# Patient Record
Sex: Female | Born: 1975 | Race: White | Hispanic: No | Smoking: Current every day smoker
Health system: Southern US, Community
[De-identification: ages and names within clinical notes are randomized; demographics above are authoritative.]

## PROBLEM LIST (undated history)

## (undated) DIAGNOSIS — R928 Other abnormal and inconclusive findings on diagnostic imaging of breast: Secondary | ICD-10-CM

## (undated) HISTORY — DX: Other abnormal and inconclusive findings on diagnostic imaging of breast: R92.8

## (undated) HISTORY — PX: TUBAL LIGATION: SHX77

---

## 2008-08-07 ENCOUNTER — Other Ambulatory Visit: Admission: RE | Admit: 2008-08-07 | Discharge: 2008-08-07 | Payer: Self-pay | Admitting: Obstetrics and Gynecology

## 2012-06-26 ENCOUNTER — Emergency Department (HOSPITAL_COMMUNITY)
Admission: EM | Admit: 2012-06-26 | Discharge: 2012-06-26 | Disposition: A | Discharge status: Home / Self Care | Payer: Medicaid Other | Attending: Emergency Medicine | Admitting: Emergency Medicine

## 2012-06-26 ENCOUNTER — Encounter (HOSPITAL_COMMUNITY): Payer: Self-pay

## 2012-06-26 DIAGNOSIS — B9689 Other specified bacterial agents as the cause of diseases classified elsewhere: Secondary | ICD-10-CM | POA: Insufficient documentation

## 2012-06-26 DIAGNOSIS — N939 Abnormal uterine and vaginal bleeding, unspecified: Secondary | ICD-10-CM

## 2012-06-26 DIAGNOSIS — A499 Bacterial infection, unspecified: Secondary | ICD-10-CM | POA: Insufficient documentation

## 2012-06-26 DIAGNOSIS — N760 Acute vaginitis: Secondary | ICD-10-CM | POA: Insufficient documentation

## 2012-06-26 DIAGNOSIS — F17200 Nicotine dependence, unspecified, uncomplicated: Secondary | ICD-10-CM | POA: Insufficient documentation

## 2012-06-26 LAB — WET PREP, GENITAL
Trich, Wet Prep: NONE SEEN
Yeast Wet Prep HPF POC: NONE SEEN

## 2012-06-26 LAB — URINALYSIS, ROUTINE W REFLEX MICROSCOPIC
Glucose, UA: NEGATIVE mg/dL
Leukocytes, UA: NEGATIVE
Specific Gravity, Urine: 1.015 (ref 1.005–1.030)
pH: 6 (ref 5.0–8.0)

## 2012-06-26 MED ORDER — METRONIDAZOLE 500 MG PO TABS: 500.0000 mg | ORAL_TABLET | Freq: Two times a day (BID) | ORAL | Status: AC

## 2012-06-26 NOTE — ED Notes (Signed)
Pt c/o vaginal bleeding after having sex. Denies abdominal pain or vaginal discharge. Pt alert and oriented x 3. Skin warm and dry. Color pink .

## 2012-06-26 NOTE — ED Provider Notes (Signed)
History     CSN: [id number]  Arrival date & time 06/26/12  1514   First MD Initiated Contact with Patient 06/26/12 1516      Chief Complaint  Patient presents with  . Vaginal Bleeding     HPI Pt was seen at 1525.  Per pt, c/o gradual onset and persistence of intermittent episodes of vaginal bleeding for the past week.  Pt states the vaginal bleeding only occurs during sex.  Endorses her usual menses was within the last week.  Denies partner has any piercings in his penis.  Denies injury.  Denies abd/pelvic pain, no vaginal discharge, no dysuria/hematuria, no N/V/D, no fevers, no rash.    [facility]N: [facility] reviewed. No pertinent past medical history.  Past Surgical History  Procedure Date  . Tubal ligation     History  Substance Use Topics  . Smoking status: Current Everyday Smoker    Types: Cigarettes  . Smokeless tobacco: Not on file  . Alcohol Use: No    Review of Systems ROS: Statement: All systems negative except as marked or noted in the HPI; Constitutional: Negative for fever and chills. ; ; Eyes: Negative for eye pain, redness and discharge. ; ; ENMT: Negative for ear pain, hoarseness, nasal congestion, sinus pressure and sore throat. ; ; Cardiovascular: Negative for chest pain, palpitations, diaphoresis, dyspnea and peripheral edema. ; ; Respiratory: Negative for cough, wheezing and stridor. ; ; Gastrointestinal: Negative for nausea, vomiting, diarrhea, abdominal pain, blood in stool, hematemesis, jaundice and rectal bleeding. . ; ; Genitourinary: Negative for dysuria, flank pain and hematuria. ; ; GYN:  +vaginal bleeding, no vaginal discharge, no vulvar pain.;; Musculoskeletal: Negative for back pain and neck pain. Negative for swelling and trauma.; ; Skin: Negative for pruritus, rash, abrasions, blisters, bruising and skin lesion.; ; Neuro: Negative for headache, lightheadedness and neck stiffness. Negative for weakness, altered level of consciousness , altered  mental status, extremity weakness, paresthesias, involuntary movement, seizure and syncope.     Allergies  Review of patient's allergies indicates no known allergies.  Home Medications  No current outpatient prescriptions on file.  BP 108/68  Pulse 99  Temp 97.8 F (36.6 C) (Oral)  Resp 18  Ht 5\' 5"  (1.651 m)  Wt 112 lb (50.803 kg)  BMI 18.64 kg/m2  SpO2 100%  LMP 06/17/2012  Physical Exam 1530: Physical examination:  Nursing notes reviewed; Vital signs and O2 SAT reviewed;  Constitutional: Well developed, Well nourished, Well hydrated, In no acute distress; Head:  Normocephalic, atraumatic; Eyes: EOMI, PERRL, No scleral icterus; ENMT: Mouth and pharynx normal, Mucous membranes moist; Neck: Supple, Full range of motion, No lymphadenopathy; Cardiovascular: Regular rate and rhythm, No murmur, rub, or gallop; Respiratory: Breath sounds clear & equal bilaterally, No rales, rhonchi, wheezes.  Speaking full sentences with ease, Normal respiratory effort/excursion; Chest: Nontender, Movement normal; Abdomen: Soft, Nontender, Nondistended, Normal bowel sounds; Genitourinary: Pelvic exam performed with permission of pt and female ED RN assist during exam.  External genitalia w/o lesions. Vaginal vault without discharge, scant amount of blood from os.  Cervix w/o lesions, not friable, GC/chlam and wet prep obtained and sent to lab.  Bimanual exam w/o CMT, uterine or adnexal tenderness.;; Extremities: Pulses normal, No tenderness, No edema, No calf edema or asymmetry.; Neuro: AA&Ox3, Major CN grossly intact.  Speech clear. Gait steady. No gross focal motor or sensory deficits in extremities.; Skin: Color normal, Warm, Dry.   ED Course  Procedures   MDM  MDM Reviewed: nursing  note, vitals and previous chart Interpretation: labs   Results for orders placed during the hospital encounter of 06/26/12  PREGNANCY, URINE      Component Value Range   Preg Test, Ur NEGATIVE  NEGATIVE  URINALYSIS,  ROUTINE W REFLEX MICROSCOPIC      Component Value Range   Color, Urine YELLOW  YELLOW   APPearance CLEAR  CLEAR   Specific Gravity, Urine 1.015  1.005 - 1.030   pH 6.0  5.0 - 8.0   Glucose, UA NEGATIVE  NEGATIVE mg/dL   Hgb urine dipstick MODERATE (*) NEGATIVE   Bilirubin Urine NEGATIVE  NEGATIVE   Ketones, ur NEGATIVE  NEGATIVE mg/dL   Protein, ur NEGATIVE  NEGATIVE mg/dL   Urobilinogen, UA 0.2  0.0 - 1.0 mg/dL   Nitrite NEGATIVE  NEGATIVE   Leukocytes, UA NEGATIVE  NEGATIVE  WET PREP, GENITAL      Component Value Range   Yeast Wet Prep HPF POC NONE SEEN  NONE SEEN   Trich, Wet Prep NONE SEEN  NONE SEEN   Clue Cells Wet Prep HPF POC FEW (*) NONE SEEN   WBC, Wet Prep HPF POC NONE SEEN  NONE SEEN  URINE MICROSCOPIC-ADD ON      Component Value Range   Squamous Epithelial / LPF FEW (*) RARE   WBC, UA 0-2  <3 WBC/hpf   RBC / HPF 3-6  <3 RBC/hpf     3:58 PM:  Will treat for BV.  Dx testing d/w pt.  Questions answered.  Verb understanding, agreeable to d/c home with outpt f/u with her OB/GYN.          Laray Anger, DO 06/27/12 1953

## 2012-06-26 NOTE — ED Notes (Signed)
Dr. Clarene Duke in to see patient and pelvic exam done.

## 2012-06-26 NOTE — ED Notes (Signed)
Pt states she had had vaginal bleeding during sex

## 2012-06-27 LAB — GC/CHLAMYDIA PROBE AMP, GENITAL: GC Probe Amp, Genital: NEGATIVE

## 2013-10-23 ENCOUNTER — Ambulatory Visit: Payer: Self-pay | Admitting: Obstetrics & Gynecology

## 2014-02-15 ENCOUNTER — Other Ambulatory Visit: Payer: Self-pay | Admitting: Obstetrics & Gynecology

## 2014-02-16 ENCOUNTER — Other Ambulatory Visit: Payer: Self-pay | Admitting: Obstetrics & Gynecology

## 2014-02-26 ENCOUNTER — Other Ambulatory Visit: Payer: Self-pay | Admitting: Obstetrics & Gynecology

## 2014-03-05 ENCOUNTER — Other Ambulatory Visit: Payer: Self-pay | Admitting: Obstetrics & Gynecology

## 2014-03-05 ENCOUNTER — Encounter (INDEPENDENT_AMBULATORY_CARE_PROVIDER_SITE_OTHER): Payer: Self-pay

## 2018-03-28 ENCOUNTER — Emergency Department (HOSPITAL_COMMUNITY): Payer: No Typology Code available for payment source

## 2018-03-28 ENCOUNTER — Other Ambulatory Visit: Payer: Self-pay

## 2018-03-28 ENCOUNTER — Emergency Department (HOSPITAL_COMMUNITY)
Admission: EM | Admit: 2018-03-28 | Discharge: 2018-03-28 | Disposition: A | Discharge status: Home / Self Care | Payer: No Typology Code available for payment source | Attending: Emergency Medicine | Admitting: Emergency Medicine

## 2018-03-28 ENCOUNTER — Encounter (HOSPITAL_COMMUNITY): Payer: Self-pay | Admitting: Emergency Medicine

## 2018-03-28 DIAGNOSIS — S199XXA Unspecified injury of neck, initial encounter: Secondary | ICD-10-CM | POA: Diagnosis present

## 2018-03-28 DIAGNOSIS — S0081XA Abrasion of other part of head, initial encounter: Secondary | ICD-10-CM | POA: Insufficient documentation

## 2018-03-28 DIAGNOSIS — Y9389 Activity, other specified: Secondary | ICD-10-CM | POA: Diagnosis not present

## 2018-03-28 DIAGNOSIS — Y92410 Unspecified street and highway as the place of occurrence of the external cause: Secondary | ICD-10-CM | POA: Insufficient documentation

## 2018-03-28 DIAGNOSIS — S161XXA Strain of muscle, fascia and tendon at neck level, initial encounter: Secondary | ICD-10-CM | POA: Insufficient documentation

## 2018-03-28 DIAGNOSIS — S39012A Strain of muscle, fascia and tendon of lower back, initial encounter: Secondary | ICD-10-CM | POA: Insufficient documentation

## 2018-03-28 DIAGNOSIS — Y998 Other external cause status: Secondary | ICD-10-CM | POA: Insufficient documentation

## 2018-03-28 DIAGNOSIS — F17210 Nicotine dependence, cigarettes, uncomplicated: Secondary | ICD-10-CM | POA: Diagnosis not present

## 2018-03-28 DIAGNOSIS — R51 Headache: Secondary | ICD-10-CM | POA: Insufficient documentation

## 2018-03-28 MED ORDER — IBUPROFEN 600 MG PO TABS: 600.0000 mg | ORAL_TABLET | Freq: Four times a day (QID) | ORAL | 0 refills | Status: DC | PRN

## 2018-03-28 MED ORDER — CYCLOBENZAPRINE HCL 10 MG PO TABS: 10.0000 mg | ORAL_TABLET | Freq: Three times a day (TID) | ORAL | 0 refills | Status: DC | PRN
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
## 2018-03-28 NOTE — Discharge Instructions (Addendum)
As discussed, apply ice packs on/off to your neck and back.  Avoid bending and heavy lifting for one week.  Follow-up with your doctor for recheck.  Return here for any worsening symptoms such as persistent vomiting, sudden visual changes, lethargy or dizziness.

## 2018-03-28 NOTE — ED Notes (Signed)
Spoke with dr Effie Shywentz about pt   Orders received
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
## 2018-04-01 ENCOUNTER — Emergency Department (HOSPITAL_COMMUNITY)
Admission: EM | Admit: 2018-04-01 | Discharge: 2018-04-01 | Disposition: A | Discharge status: Home / Self Care | Payer: No Typology Code available for payment source | Attending: Emergency Medicine | Admitting: Emergency Medicine

## 2018-04-01 ENCOUNTER — Other Ambulatory Visit: Payer: Self-pay

## 2018-04-01 ENCOUNTER — Encounter (HOSPITAL_COMMUNITY): Payer: Self-pay | Admitting: Emergency Medicine

## 2018-04-01 DIAGNOSIS — Y939 Activity, unspecified: Secondary | ICD-10-CM | POA: Insufficient documentation

## 2018-04-01 DIAGNOSIS — S060X0A Concussion without loss of consciousness, initial encounter: Secondary | ICD-10-CM | POA: Insufficient documentation

## 2018-04-01 DIAGNOSIS — F17210 Nicotine dependence, cigarettes, uncomplicated: Secondary | ICD-10-CM | POA: Diagnosis not present

## 2018-04-01 DIAGNOSIS — R110 Nausea: Secondary | ICD-10-CM | POA: Diagnosis not present

## 2018-04-01 DIAGNOSIS — Y998 Other external cause status: Secondary | ICD-10-CM | POA: Diagnosis not present

## 2018-04-01 DIAGNOSIS — R42 Dizziness and giddiness: Secondary | ICD-10-CM | POA: Diagnosis not present

## 2018-04-01 DIAGNOSIS — R51 Headache: Secondary | ICD-10-CM | POA: Insufficient documentation

## 2018-04-01 DIAGNOSIS — Y92410 Unspecified street and highway as the place of occurrence of the external cause: Secondary | ICD-10-CM | POA: Insufficient documentation

## 2018-04-01 DIAGNOSIS — M542 Cervicalgia: Secondary | ICD-10-CM | POA: Diagnosis not present

## 2018-04-01 DIAGNOSIS — S0990XD Unspecified injury of head, subsequent encounter: Secondary | ICD-10-CM | POA: Diagnosis present

## 2018-04-01 MED ORDER — ONDANSETRON HCL 4 MG PO TABS: 4.0000 mg | ORAL_TABLET | Freq: Four times a day (QID) | ORAL | 0 refills | Status: DC
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
## 2018-04-01 NOTE — ED Notes (Signed)
Pt continues to await provider.  Denies any needs.

## 2018-04-01 NOTE — Discharge Instructions (Signed)
Your vital signs are within normal limits.  I reviewed the CT scans from your previous visit, and no changes appreciated.  Please see Dr. Ayesha MohairZack Smith at the concussion clinic for additional evaluation and management.  Use Zofran as needed for the nausea.  Please change her positions slowly when standing or walking or sitting.  Continue your ibuprofen for the soreness in your neck.  Please return to the emergency department if any changes in your condition, problems, or concerns.

## 2018-05-09 ENCOUNTER — Other Ambulatory Visit: Payer: Self-pay

## 2018-05-09 ENCOUNTER — Emergency Department (HOSPITAL_COMMUNITY)
Admission: EM | Admit: 2018-05-09 | Discharge: 2018-05-09 | Disposition: A | Discharge status: Home / Self Care | Payer: Self-pay | Attending: Emergency Medicine | Admitting: Emergency Medicine

## 2018-05-09 ENCOUNTER — Encounter (HOSPITAL_COMMUNITY): Payer: Self-pay

## 2018-05-09 DIAGNOSIS — L03213 Periorbital cellulitis: Secondary | ICD-10-CM | POA: Insufficient documentation

## 2018-05-09 DIAGNOSIS — F17210 Nicotine dependence, cigarettes, uncomplicated: Secondary | ICD-10-CM | POA: Insufficient documentation

## 2018-05-09 MED ORDER — PREDNISONE 20 MG PO TABS: 20.0000 mg | ORAL_TABLET | Freq: Two times a day (BID) | ORAL | 0 refills | Status: DC

## 2018-05-09 MED ORDER — CEPHALEXIN 500 MG PO CAPS: 500.0000 mg | ORAL_CAPSULE | Freq: Three times a day (TID) | ORAL | 0 refills | Status: DC

## 2018-05-09 MED ORDER — CEPHALEXIN 500 MG PO CAPS
500.0000 mg | ORAL_CAPSULE | Freq: Once | ORAL | Status: AC
  Administered 2018-05-09: 500 mg via ORAL
  Filled 2018-05-09: qty 1

## 2018-05-09 MED ORDER — PREDNISONE 50 MG PO TABS
60.0000 mg | ORAL_TABLET | Freq: Once | ORAL | Status: AC
  Administered 2018-05-09: 60 mg via ORAL
  Filled 2018-05-09: qty 1

## 2018-05-09 NOTE — ED Triage Notes (Addendum)
Patient has laceration noted above left upper eye lid due to dog fence hitting eye lid Friday. Patient denies any injury to eye. Swelling noted to left eye into left cheek face. Denies visual disturbances. Last tetanus shot unknown.

## 2018-05-09 NOTE — Discharge Instructions (Addendum)
Cool compress to eye three times per day. Recheck with fever, ANY, svsion changes, pain with movement of eye, or other changes

## 2018-05-09 NOTE — ED Provider Notes (Signed)
[facility] EMERGENCY DEPARTMENT Provider Note   CSN: [id number] Arrival date & time: 05/09/18  0945     History   Chief Complaint Chief Complaint  Patient presents with  . Eye Injury    HPI Renee Fuller is a 42 y.o. female.  Complaint is eye swelling after injury.  HPI 42 year old female.  2 days ago she was leaning over next to a fence.  A piece of protruding wire poked her in her left upper eyelid.  Her vision has been normal.  Minimal bleeding.  However it became more swollen last night and this morning and she presents here.  She states she went to work today but left because "it was embarrassing".  History reviewed. No pertinent past medical history.  There are no active problems to display for this patient.   Past Surgical History:  Procedure Laterality Date  . TUBAL LIGATION       OB History   None      Home Medications    Prior to Admission medications   Medication Sig Start Date End Date Taking? Authorizing Provider  cephALEXin (KEFLEX) 500 MG capsule Take 1 capsule (500 mg total) by mouth 3 (three) times daily. 05/09/18   Rolland Porter, MD  cyclobenzaprine (FLEXERIL) 10 MG tablet Take 1 tablet (10 mg total) by mouth 3 (three) times daily as needed. 03/28/18   Triplett, Tammy, PA-C  ibuprofen (ADVIL,MOTRIN) 600 MG tablet Take 1 tablet (600 mg total) by mouth every 6 (six) hours as needed. Take with food 03/28/18   Triplett, Tammy, PA-C  ondansetron (ZOFRAN) 4 MG tablet Take 1 tablet (4 mg total) by mouth every 6 (six) hours. 04/01/18   Ivery Quale, PA-C  predniSONE (DELTASONE) 20 MG tablet Take 1 tablet (20 mg total) by mouth 2 (two) times daily with a meal. 05/09/18   Rolland Porter, MD    Family History No family history on file.  Social History Social History   Tobacco Use  . Smoking status: Current Every Day Smoker    Types: Cigarettes  . Smokeless tobacco: Never Used  Substance Use Topics  . Alcohol use: No  . Drug use: No     Allergies     Patient has no known allergies.   Review of Systems Review of Systems  Constitutional: Negative for appetite change, chills, diaphoresis, fatigue and fever.  HENT: Negative for mouth sores, sore throat and trouble swallowing.   Eyes: Negative for visual disturbance.       Eyelid swelling  Respiratory: Negative for cough, chest tightness, shortness of breath and wheezing.   Cardiovascular: Negative for chest pain.  Gastrointestinal: Negative for abdominal distention, abdominal pain, diarrhea, nausea and vomiting.  Endocrine: Negative for polydipsia, polyphagia and polyuria.  Genitourinary: Negative for dysuria, frequency and hematuria.  Musculoskeletal: Negative for gait problem.  Skin: Negative for color change, pallor and rash.  Neurological: Negative for dizziness, syncope, light-headedness and headaches.  Hematological: Does not bruise/bleed easily.  Psychiatric/Behavioral: Negative for behavioral problems and confusion.     Physical Exam Updated Vital Signs BP 112/67 (BP Location: Right Arm)   Pulse 77   Temp 97.7 F (36.5 C) (Oral)   Resp 14   Ht  (1.6 m)   Wt 53.5 kg (118 lb)   LMP 05/09/2018   SpO2 100%   BMI 20.90 kg/m   Physical Exam  Constitutional: She is oriented to person, place, and time. She appears well-developed and well-nourished. No distress.  HENT:  Head: Normocephalic.  Eyes: Pupils are equal, round, and reactive to light. Conjunctivae are normal. No scleral icterus.  Soft tissue swelling and erythema of the left upper lid greater than left lower lid.  No extraocular movement entrapment.  No proptosis or enophthalmos.  Normal-appearing globe.  Symmetric pupil.  No direct or consensual photophobia.  The lid is everted and there is no full-thickness puncture.  Neck: Normal range of motion. Neck supple. No thyromegaly present.  Cardiovascular: Normal rate and regular rhythm. Exam reveals no gallop and no friction rub.  No murmur  heard. Pulmonary/Chest: Effort normal and breath sounds normal. No respiratory distress. She has no wheezes. She has no rales.  Abdominal: Soft. Bowel sounds are normal. She exhibits no distension. There is no tenderness. There is no rebound.  Musculoskeletal: Normal range of motion.  Neurological: She is alert and oriented to person, place, and time.  Skin: Skin is warm and dry. No rash noted.  Psychiatric: She has a normal mood and affect. Her behavior is normal.     ED Treatments / Results  Labs (all labs ordered are listed, but only abnormal results are displayed) Labs Reviewed - No data to display  EKG None  Radiology No results found.  Procedures Procedures (including critical care time)  Medications Ordered in ED Medications  predniSONE (DELTASONE) tablet 60 mg (60 mg Oral Given 05/09/18 1351)  cephALEXin (KEFLEX) capsule 500 mg (500 mg Oral Given 05/09/18 1351)     Initial Impression / Assessment and Plan / ED Course  I have reviewed the triage vital signs and the nursing notes.  Pertinent labs & imaging results that were available during my care of the patient were reviewed by me and considered in my medical decision making (see chart for details).     Likely simply periorbital edema.  Will do twice daily prednisone x2 days.  Warm or cool compresses for comfort.  Keflex.  Recheck vision changes, pain with eye movement, other worsening.  Final Clinical Impressions(s) / ED Diagnoses   Final diagnoses:  Periorbital cellulitis of left eye    ED Discharge Orders        Ordered    predniSONE (DELTASONE) 20 MG tablet  2 times daily with meals     05/09/18 1354    cephALEXin (KEFLEX) 500 MG capsule  3 times daily     05/09/18 1354       Rolland Porter, MD 05/09/18 1356

## 2018-11-30 ENCOUNTER — Other Ambulatory Visit: Payer: Self-pay

## 2018-11-30 ENCOUNTER — Encounter (HOSPITAL_COMMUNITY): Payer: Self-pay | Admitting: Emergency Medicine

## 2018-11-30 ENCOUNTER — Emergency Department (HOSPITAL_COMMUNITY)
Admission: EM | Admit: 2018-11-30 | Discharge: 2018-11-30 | Disposition: A | Discharge status: Home / Self Care | Payer: Self-pay | Attending: Emergency Medicine | Admitting: Emergency Medicine

## 2018-11-30 DIAGNOSIS — N611 Abscess of the breast and nipple: Secondary | ICD-10-CM | POA: Insufficient documentation

## 2018-11-30 DIAGNOSIS — F17210 Nicotine dependence, cigarettes, uncomplicated: Secondary | ICD-10-CM | POA: Insufficient documentation

## 2018-11-30 MED ORDER — CEFAZOLIN SODIUM-DEXTROSE 1-4 GM/50ML-% IV SOLN
1.0000 g | Freq: Once | INTRAVENOUS | Status: AC
  Administered 2018-11-30: 1 g via INTRAVENOUS
  Filled 2018-11-30: qty 50

## 2018-11-30 MED ORDER — SULFAMETHOXAZOLE-TRIMETHOPRIM 800-160 MG PO TABS
1.0000 | ORAL_TABLET | Freq: Once | ORAL | Status: AC
  Administered 2018-11-30: 1 via ORAL
  Filled 2018-11-30: qty 1

## 2018-11-30 MED ORDER — IBUPROFEN 400 MG PO TABS
400.0000 mg | ORAL_TABLET | Freq: Once | ORAL | Status: AC
  Administered 2018-11-30: 400 mg via ORAL
  Filled 2018-11-30: qty 1

## 2018-11-30 MED ORDER — LIDOCAINE-EPINEPHRINE (PF) 2 %-1:200000 IJ SOLN
10.0000 mL | Freq: Once | INTRAMUSCULAR | Status: AC
  Administered 2018-11-30: 10 mL
  Filled 2018-11-30: qty 10

## 2018-11-30 MED ORDER — SULFAMETHOXAZOLE-TRIMETHOPRIM 800-160 MG PO TABS: 1.0000 | ORAL_TABLET | Freq: Two times a day (BID) | ORAL | 0 refills | Status: AC

## 2018-11-30 MED ORDER — PENTAFLUOROPROP-TETRAFLUOROETH EX AERO
INHALATION_SPRAY | Freq: Once | CUTANEOUS | Status: AC
  Administered 2018-11-30: 21:00:00 via TOPICAL
  Filled 2018-11-30: qty 116

## 2018-11-30 NOTE — Discharge Instructions (Addendum)
Change your dressings if needed (if it drains through) before your appointment with Dr. Henreitta LeberBridges tomorrow. If it is still clean, you may leave it in place.  Take your next dose of the antibiotic tomorrow morning.  Your appointment with Dr. Henreitta LeberBridges is at 2:30 in her office.

## 2018-11-30 NOTE — ED Provider Notes (Signed)
[facility] EMERGENCY DEPARTMENT Provider Note   CSN: [id number] Arrival date & time: 11/30/18  1847     History   Chief Complaint Chief Complaint  Patient presents with  . Abscess    HPI Renee Fuller is a 42 y.o. female.  The history is provided by the patient.  Abscess  Location:  Torso Torso abscess location:  R breast Abscess quality: fluctuance, induration, painful, redness and warmth   Duration:  1 week Progression:  Worsening Pain details:    Quality:  Sharp and throbbing   Severity:  Severe   Progression:  Worsening Chronicity:  New Context: not diabetes and not skin injury   Relieved by:  Nothing Worsened by:  Nothing Ineffective treatments:  Warm water soaks Associated symptoms: no fever, no headaches, no nausea and no vomiting   Risk factors: no prior abscess   Risk factors comment:  Reports she had a breast nodule at this same site 10 years ago, was advised to get a biopsy but never did (while living in [place]).    History reviewed. No pertinent past medical history.  There are no active problems to display for this patient.   Past Surgical History:  Procedure Laterality Date  . TUBAL LIGATION       OB History   None      Home Medications    Prior to Admission medications   Medication Sig Start Date End Date Taking? Authorizing Provider  cephALEXin (KEFLEX) 500 MG capsule Take 1 capsule (500 mg total) by mouth 3 (three) times daily. 05/09/18   Rolland PorterJames, Mark, MD  cyclobenzaprine (FLEXERIL) 10 MG tablet Take 1 tablet (10 mg total) by mouth 3 (three) times daily as needed. 03/28/18   Triplett, Tammy, PA-C  ibuprofen (ADVIL,MOTRIN) 600 MG tablet Take 1 tablet (600 mg total) by mouth every 6 (six) hours as needed. Take with food 03/28/18   Triplett, Tammy, PA-C  ondansetron (ZOFRAN) 4 MG tablet Take 1 tablet (4 mg total) by mouth every 6 (six) hours. 04/01/18   Ivery QualeBryant, Hobson, PA-C  predniSONE (DELTASONE) 20 MG tablet Take 1 tablet (20 mg  total) by mouth 2 (two) times daily with a meal. 05/09/18   Rolland PorterJames, Mark, MD  sulfamethoxazole-trimethoprim (BACTRIM DS,SEPTRA DS) 800-160 MG tablet Take 1 tablet by mouth 2 (two) times daily for 10 days. 11/30/18 12/10/18  Burgess AmorIdol, Deundre Thong, PA-C    Family History No family history on file.  Social History Social History   Tobacco Use  . Smoking status: Current Every Day Smoker    Types: Cigarettes  . Smokeless tobacco: Never Used  Substance Use Topics  . Alcohol use: No  . Drug use: No     Allergies   Patient has no known allergies.   Review of Systems Review of Systems  Constitutional: Negative for fever.  HENT: Negative for congestion and sore throat.   Eyes: Negative.   Respiratory: Negative for shortness of breath.   Cardiovascular: Negative.   Gastrointestinal: Negative for nausea and vomiting.  Genitourinary: Negative.   Musculoskeletal: Negative for arthralgias, joint swelling and neck pain.  Skin: Positive for color change.       Negative except as mentioned in HPI.   Neurological: Negative for dizziness, weakness, light-headedness, numbness and headaches.  Psychiatric/Behavioral: Negative.      Physical Exam Updated Vital Signs BP 119/62   Pulse 87   Temp 98.4 F (36.9 C) (Oral)   Resp 20   Ht 5\' 3"  (1.6 m)   Wt  53.5 kg   LMP 11/16/2018   SpO2 100%   BMI 20.90 kg/m   Physical Exam  Constitutional: She appears well-developed and well-nourished.  HENT:  Head: Normocephalic and atraumatic.  Eyes: Conjunctivae are normal.  Neck: Normal range of motion.  Cardiovascular: Normal rate, regular rhythm, normal heart sounds and intact distal pulses.  Pulmonary/Chest: Effort normal and breath sounds normal. She has no wheezes.  Large area of erythema and induration with more fluctuance noted at the medial aspect of the nipple which is inverted (pt states has been inverted since her last pregnancy).     Abdominal: Soft. Bowel sounds are normal. There is no  tenderness.  Musculoskeletal: Normal range of motion.  Neurological: She is alert.  Skin: Skin is warm and dry.  Psychiatric: She has a normal mood and affect.  Nursing note and vitals reviewed.    ED Treatments / Results  Labs (all labs ordered are listed, but only abnormal results are displayed) Labs Reviewed - No data to display  EKG None  Radiology No results found.  Procedures Procedures (including critical care time)  Medications Ordered in ED Medications  ibuprofen (ADVIL,MOTRIN) tablet 400 mg (400 mg Oral Given 11/30/18 1933)  ceFAZolin (ANCEF) IVPB 1 g/50 mL premix (0 g Intravenous Stopped 11/30/18 2124)  lidocaine-EPINEPHrine (XYLOCAINE W/EPI) 2 %-1:200000 (PF) injection 10 mL (10 mLs Infiltration Given by Other 11/30/18 2124)  pentafluoroprop-tetrafluoroeth (GEBAUERS) aerosol ( Topical Given by Other 11/30/18 2124)  sulfamethoxazole-trimethoprim (BACTRIM DS,SEPTRA DS) 800-160 MG per tablet 1 tablet (1 tablet Oral Given 11/30/18 2148)     Initial Impression / Assessment and Plan / ED Course  I have reviewed the triage vital signs and the nursing notes.  Pertinent labs & imaging results that were available during my care of the patient were reviewed by me and considered in my medical decision making (see chart for details).     Discussed with Dr. Henreitta Leber who agrees to see pt in her office. appt for tomorrow at 2:30 pm.  Advised IV ancef while here which was given.  Started on bactrim for home use.  Pt was also seen by Dr Particia Nearing during this ed visit who was able to complete a partial I&D of the site with moderate purulence obtained and partial pain relief.  Pt understands she will probably need further I&D as this procedure tonight was for temporary pain improvement only.    Refer to Dr Fredrich Birks procedure note.  Final Clinical Impressions(s) / ED Diagnoses   Final diagnoses:  Breast abscess    ED Discharge Orders         Ordered     sulfamethoxazole-trimethoprim (BACTRIM DS,SEPTRA DS) 800-160 MG tablet  2 times daily     11/30/18 2130           Burgess Amor, PA-C 11/30/18 2349    Jacalyn Lefevre, MD 12/05/18 [phone]

## 2018-11-30 NOTE — ED Triage Notes (Signed)
Pt c/o abscess to the right breast x one week.

## 2018-12-01 ENCOUNTER — Encounter: Payer: Self-pay | Admitting: General Surgery

## 2018-12-01 ENCOUNTER — Ambulatory Visit (INDEPENDENT_AMBULATORY_CARE_PROVIDER_SITE_OTHER): Payer: Self-pay | Admitting: General Surgery

## 2018-12-01 VITALS — BP 98/58 | HR 77 | Temp 98.6°F | Resp 18 | Wt 121.0 lb

## 2018-12-01 DIAGNOSIS — N611 Abscess of the breast and nipple: Secondary | ICD-10-CM | POA: Insufficient documentation

## 2018-12-01 NOTE — Progress Notes (Signed)
[facility] History and Physical  Reason for Referral: Right breast abscess  Referring Physician:  Burgess AmorJulie Idol PA (ED)   Chief Complaint    Breast Problem      Renee Fuller is a 42 y.o. female.  HPI: Renee Fuller is a 42 yo with no reported medical issues who came into the ED with complaints of pain and swelling of the right breast for over 1 week. She had a prior "Knot" in the area about 10 years ago and had a mammogram. She was noted to get a biopsy but never followed up. She presented with the abscess and was seen by the ED. She underwent an aspiration by Dr. Particia NearingHaviland, and received some IV antibiotics at my request and was sent home on oral antibiotics. The patient has no known family history of breast cancer but her mother was adopted.   She says that since the drainage her pain has much improved and the redness and swelling are going down.  She reports this prior "knot" in the right breast but no other lumps or bumps. 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  She has not noticed any other changes in her breast.      Past Medical History:  Diagnosis Date  . Abnormal mammogram    10 years ago, told to have biopsy but never done     Past Surgical History:  Procedure Laterality Date  . TUBAL LIGATION      History reviewed. No pertinent family history. mother adopted. No known breast cancer.   Social History   Tobacco Use  . Smoking status: Current Every Day Smoker    Types: Cigarettes  . Smokeless tobacco: Never Used  Substance Use Topics  . Alcohol use: No  . Drug use: No    Medications: I have reviewed the patient's current medications. Allergies as of 12/01/2018   No Known Allergies     Medication List       Accurate as of December 01, 2018 11:59 PM. Always use your most recent med list.        sulfamethoxazole-trimethoprim 800-160 MG tablet Commonly known as:  BACTRIM DS,SEPTRA  DS Take 1 tablet by mouth 2 (two) times daily for 10 days.        ROS:  A comprehensive review of systems was negative except for: Constitutional: positive for malaise Integument/breast: positive for breast tenderness and swelling, redness of right breast  Blood pressure (!) 98/58, pulse 77, temperature 98.6 F (37 C), temperature source Temporal, resp. rate 18, weight 121 lb (54.9 kg), last menstrual period 11/16/2018. Physical Exam Vitals signs reviewed.  Constitutional:      Appearance: Normal appearance.  HENT:     Head: Normocephalic and atraumatic.     Mouth/Throat:     Mouth: Mucous membranes are moist.  Eyes:     Extraocular Movements: Extraocular movements intact.     Pupils: Pupils are equal, round, and reactive to light.  Neck:     Musculoskeletal: Normal range of motion.  Cardiovascular:     Rate and Rhythm: Normal rate and regular rhythm.  Pulmonary:     Effort: Pulmonary effort is normal.     Breath sounds: Normal breath sounds.  Chest:     Breasts:        Right: Swelling and inverted nipple present.        Left: Normal.     Comments: Redness and induration medial  aspect of right breast, tender Abdominal:     General: Abdomen is flat. There is no distension.     Palpations: Abdomen is soft.     Tenderness: There is no abdominal tenderness.  Musculoskeletal: Normal range of motion.  Lymphadenopathy:     Upper Body:     Right upper body: No axillary adenopathy.     Left upper body: No axillary adenopathy.  Skin:    General: Skin is warm and dry.  Neurological:     General: No focal deficit present.     Mental Status: She is alert and oriented to person, place, and time.  Psychiatric:        Mood and Affect: Mood normal.        Behavior: Behavior normal.        Thought Content: Thought content normal.        Judgment: Judgment normal.     Results: None   Assessment & Plan:  Renee Fuller is a 42 y.o. female with a right breast abscess  s/p aspiration by the ED and antibiotic treatment. There is some history of a mammogram that was abnormal for  Knot and recommendations for a biopsy that the patient did not receive. She says that she has had no issues or changes since that time with her breast until now.  She is feeling a little tired but the breast is feeling better.   -Continue antibiotics  -Follow up for recheck of breast next week  -Will need mammogram 8 weeks out to assess for any abnormalities. She has no PCP currently, so we may need to order this mammogram.   All questions were answered to the satisfaction of the patient.    Lucretia Roers 12/02/2018, 3:15 PM

## 2018-12-01 NOTE — Patient Instructions (Signed)
Continue antibiotics

## 2018-12-02 ENCOUNTER — Encounter: Payer: Self-pay | Admitting: General Surgery

## 2018-12-05 NOTE — ED Provider Notes (Signed)
  Physical Exam  BP 119/62   Pulse 87   Temp 98.4 F (36.9 C) (Oral)   Resp 20   Ht 5\' 3"  (1.6 m)   Wt 53.5 kg   LMP 11/16/2018   SpO2 100%   BMI 20.90 kg/m   Physical Exam  ED Course/Procedures     .Marland Kitchen.Incision and Drainage Date/Time: 12/05/2018 7:26 AM Performed by: Jacalyn LefevreHaviland, Tiant Peixoto, MD Authorized by: Jacalyn LefevreHaviland, Alen Matheson, MD   Consent:    Consent obtained:  Verbal   Consent given by:  Patient   Risks discussed:  Bleeding, incomplete drainage and pain   Alternatives discussed:  No treatment Location:    Type:  Abscess   Size:  10 by 10   Location:  Trunk   Trunk location:  R breast Pre-procedure details:    Skin preparation:  Betadine Anesthesia (see MAR for exact dosages):    Anesthesia method:  Local infiltration   Local anesthetic:  Lidocaine 1% w/o epi Procedure type:    Complexity:  Simple Procedure details:    Incision types:  Single straight   Scalpel blade:  11   Drainage:  Purulent   Drainage amount:  Copious   Wound treatment:  Wound left open   Packing materials:  None Post-procedure details:    Patient tolerance of procedure:  Tolerated well, no immediate complications    MDM         Jacalyn LefevreHaviland, Amaria Mundorf, MD 12/05/18 [id number]

## 2018-12-08 ENCOUNTER — Ambulatory Visit (INDEPENDENT_AMBULATORY_CARE_PROVIDER_SITE_OTHER): Payer: Self-pay | Admitting: General Surgery

## 2018-12-08 ENCOUNTER — Encounter: Payer: Self-pay | Admitting: General Surgery

## 2018-12-08 VITALS — BP 116/67 | HR 83 | Temp 98.4°F | Resp 18 | Wt 122.4 lb

## 2018-12-08 DIAGNOSIS — N611 Abscess of the breast and nipple: Secondary | ICD-10-CM

## 2018-12-08 NOTE — Progress Notes (Signed)
[facility] Note   HPI:  42 y.o. Female presents to clinic for follow-up evaluation of her right breast abscess. She reports she is completing her antibiotics and denies any further pain/ swelling. She says it is still a little firm.  NO fever or chills.   Review of Systems:  No drainage Improving pain Some induration All other review of systems: otherwise negative   Vital Signs:  BP 116/67 (BP Location: Left Arm, Patient Position: Sitting, Cuff Size: Normal)   Pulse 83   Temp 98.4 F (36.9 C) (Temporal)   Resp 18   Wt 122 lb 6.4 oz (55.5 kg)   LMP 11/16/2018   BMI 21.68 kg/m    Physical Exam:  Physical Exam Vitals signs reviewed.  Neck:     Musculoskeletal: Normal range of motion.  Cardiovascular:     Rate and Rhythm: Normal rate.  Pulmonary:     Effort: Pulmonary effort is normal.  Chest:     Comments: Right breast at the 3 o'clock position with some induration, nipple inverted, no erythema, minimally tender Neurological:     Mental Status: She is alert.    Assessment:  42 y.o. yo Female with a right breat abscess that has resolved with aspiration by the ED and antibiotics. She has a self reported history of a mammogram 10 years ago and recommendations for a biopsy on the right breast that she never completed.  She has not had a mammogram since that time. She is unsure of breast cancer history due to her mother being adopted.  She also reports an inverted nipple since her child was born.  We discussed the need for a mammogram after the infection has healed given her age and prior self reported history. She expresses understanding. She is getting insurance January.  She will need the Mammogram February once the inflammation from the abscess has resolved.   Plan:  - [facility] and mammogram  - Needs a mammogram in February 2019  - Follow up in March after her mammogram to verify everything is doing well   All of the  above recommendations were discussed with the patient, and all of patient's questions were answered to her expressed satisfaction.  Algis GreenhouseLindsay Victorio Creeden, MD [facility] [address] [place], [place] [id number] [phone] (office)

## 2018-12-08 NOTE — Patient Instructions (Signed)
Need a screening mammogram in February, 2019. Need to call about a primary care physician and mammogram.

## 2019-04-20 ENCOUNTER — Other Ambulatory Visit: Payer: Self-pay

## 2019-04-20 ENCOUNTER — Emergency Department (HOSPITAL_COMMUNITY): Payer: BLUE CROSS/BLUE SHIELD

## 2019-04-20 ENCOUNTER — Emergency Department (HOSPITAL_COMMUNITY)
Admission: EM | Admit: 2019-04-20 | Discharge: 2019-04-20 | Disposition: A | Discharge status: Home / Self Care | Payer: BLUE CROSS/BLUE SHIELD | Attending: Emergency Medicine | Admitting: Emergency Medicine

## 2019-04-20 ENCOUNTER — Encounter (HOSPITAL_COMMUNITY): Payer: Self-pay | Admitting: Emergency Medicine

## 2019-04-20 DIAGNOSIS — K0889 Other specified disorders of teeth and supporting structures: Secondary | ICD-10-CM

## 2019-04-20 DIAGNOSIS — R200 Anesthesia of skin: Secondary | ICD-10-CM | POA: Diagnosis present

## 2019-04-20 DIAGNOSIS — R202 Paresthesia of skin: Secondary | ICD-10-CM | POA: Diagnosis not present

## 2019-04-20 DIAGNOSIS — Z9889 Other specified postprocedural states: Secondary | ICD-10-CM | POA: Diagnosis not present

## 2019-04-20 DIAGNOSIS — F17210 Nicotine dependence, cigarettes, uncomplicated: Secondary | ICD-10-CM | POA: Insufficient documentation

## 2019-04-20 LAB — URINALYSIS, ROUTINE W REFLEX MICROSCOPIC
Bilirubin Urine: NEGATIVE
Glucose, UA: NEGATIVE mg/dL
Ketones, ur: NEGATIVE mg/dL
Leukocytes,Ua: NEGATIVE
Nitrite: NEGATIVE
Protein, ur: NEGATIVE mg/dL
Specific Gravity, Urine: 1.005 (ref 1.005–1.030)
pH: 7 (ref 5.0–8.0)

## 2019-04-20 LAB — CBC
HCT: 38.5 % (ref 36.0–46.0)
Hemoglobin: 12.6 g/dL (ref 12.0–15.0)
MCH: 32.6 pg (ref 26.0–34.0)
MCHC: 32.7 g/dL (ref 30.0–36.0)
MCV: 99.7 fL (ref 80.0–100.0)
Platelets: 260 10*3/uL (ref 150–400)
RBC: 3.86 MIL/uL — ABNORMAL LOW (ref 3.87–5.11)
RDW: 12.7 % (ref 11.5–15.5)
WBC: 8.2 10*3/uL (ref 4.0–10.5)
nRBC: 0 % (ref 0.0–0.2)

## 2019-04-20 LAB — DIFFERENTIAL
Abs Immature Granulocytes: 0.02 10*3/uL (ref 0.00–0.07)
Basophils Absolute: 0 10*3/uL (ref 0.0–0.1)
Basophils Relative: 0 %
Eosinophils Absolute: 0 10*3/uL (ref 0.0–0.5)
Eosinophils Relative: 1 %
Immature Granulocytes: 0 %
Lymphocytes Relative: 27 %
Lymphs Abs: 2.2 10*3/uL (ref 0.7–4.0)
Monocytes Absolute: 0.6 10*3/uL (ref 0.1–1.0)
Monocytes Relative: 7 %
Neutro Abs: 5.4 10*3/uL (ref 1.7–7.7)
Neutrophils Relative %: 65 %

## 2019-04-20 LAB — RAPID URINE DRUG SCREEN, HOSP PERFORMED
Amphetamines: NOT DETECTED
Barbiturates: NOT DETECTED
Benzodiazepines: NOT DETECTED
Cocaine: NOT DETECTED
Opiates: NOT DETECTED
Tetrahydrocannabinol: POSITIVE — AB

## 2019-04-20 LAB — COMPREHENSIVE METABOLIC PANEL
ALT: 11 U/L (ref 0–44)
AST: 16 U/L (ref 15–41)
Albumin: 4.8 g/dL (ref 3.5–5.0)
Alkaline Phosphatase: 59 U/L (ref 38–126)
Anion gap: 6 (ref 5–15)
BUN: 11 mg/dL (ref 6–20)
CO2: 26 mmol/L (ref 22–32)
Calcium: 9 mg/dL (ref 8.9–10.3)
Chloride: 108 mmol/L (ref 98–111)
Creatinine, Ser: 0.75 mg/dL (ref 0.44–1.00)
GFR calc Af Amer: >60 mL/min (ref >60)
GFR calc non Af Amer: >60 mL/min (ref >60)
Glucose, Bld: 78 mg/dL (ref 70–99)
Potassium: 3.6 mmol/L (ref 3.5–5.1)
Sodium: 140 mmol/L (ref 135–145)
Total Bilirubin: 0.6 mg/dL (ref 0.3–1.2)
Total Protein: 7.6 g/dL (ref 6.5–8.1)

## 2019-04-20 LAB — ETHANOL: Alcohol, Ethyl (B): <10 mg/dL (ref <10)

## 2019-04-20 LAB — APTT: aPTT: 33 seconds (ref 24–36)

## 2019-04-20 LAB — PROTIME-INR
INR: 1 (ref 0.8–1.2)
Prothrombin Time: 12.7 seconds (ref 11.4–15.2)

## 2019-04-20 LAB — I-STAT CREATININE, ED: Creatinine, Ser: 0.8 mg/dL (ref 0.44–1.00)

## 2019-04-20 LAB — POC URINE PREG, ED: Preg Test, Ur: NEGATIVE

## 2019-04-20 LAB — VITAMIN B12: Vitamin B-12: 185 pg/mL (ref 180–914)

## 2019-04-20 MED ORDER — IBUPROFEN 400 MG PO TABS
400.0000 mg | ORAL_TABLET | Freq: Once | ORAL | Status: AC
  Administered 2019-04-20: 400 mg via ORAL
  Filled 2019-04-20: qty 1

## 2019-04-20 NOTE — Discharge Instructions (Addendum)
There were no serious abnormalities found to explain the numbness.  The MRI, of the brain, did not show any evidence for stroke, tumor or bleeding.  We sent a B12 test, which will be available in a couple of days, you can follow-up on that on MyChart.  Follow-up with your primary care doctor for further care and treatment.

## 2019-04-20 NOTE — ED Notes (Signed)
ED Provider at bedside. 

## 2019-04-20 NOTE — ED Triage Notes (Signed)
Pt is supposed to have a root canal on the side upper teeth but never scheduled it.  Had right upper mouth numbness x 2 week with now having right arm numbness since yesterday morning at 1000 am

## 2019-04-20 NOTE — ED Provider Notes (Signed)
[facility] EMERGENCY DEPARTMENT Provider Note   CSN: [id number] Arrival date & time: 04/20/19  1512    History   Chief Complaint Chief Complaint  Patient presents with  . Numbness    HPI Renee Fuller is a 43 y.o. female.     HPI   She is here for evaluation of numbness, right face for 1 week and numb right arm for 1 day.  She is also having problems with pain in her right upper molars, recently treated with preparation for root canals, and had temporary fillings placed.  A temporary fillings fell out, and she has been unable to follow-up with the dental surgeon because of the pandemic.  She is using dental packing material with relief of her discomfort, from the tooth pain.  She denies fever, chills, cough, shortness of breath, weakness, dizziness, nausea, vomiting, diarrhea.  She denies blurred vision.  The numbness in her face and arm are persistent.  No prior similar paresthesia, and no preceding cardiac or CNS abnormalities.  There are no other known modifying factors.  Past Medical History:  Diagnosis Date  . Abnormal mammogram    10 years ago, told to have biopsy but never done     Patient Active Problem List   Diagnosis Date Noted  . Breast abscess 12/01/2018    Past Surgical History:  Procedure Laterality Date  . TUBAL LIGATION       OB History   No obstetric history on file.      Home Medications    Prior to Admission medications   Not on File    Family History No family history on file.  Social History Social History   Tobacco Use  . Smoking status: Current Every Day Smoker    Types: Cigarettes  . Smokeless tobacco: Never Used  Substance Use Topics  . Alcohol use: No  . Drug use: No     Allergies   Patient has no known allergies.   Review of Systems Review of Systems  All other systems reviewed and are negative.    Physical Exam Updated Vital Signs Ht 5\' 3"  (1.6 m)   Wt 54.4 kg   BMI 21.26 kg/m   Physical Exam  Vitals signs and nursing note reviewed.  Constitutional:      General: She is not in acute distress.    Appearance: She is well-developed. She is not ill-appearing, toxic-appearing or diaphoretic.  HENT:     Head: Normocephalic and atraumatic.     Right Ear: External ear normal.     Left Ear: External ear normal.  Eyes:     Conjunctiva/sclera: Conjunctivae normal.     Pupils: Pupils are equal, round, and reactive to light.  Neck:     Musculoskeletal: Normal range of motion and neck supple.     Trachea: Phonation normal.  Cardiovascular:     Rate and Rhythm: Normal rate and regular rhythm.     Heart sounds: Normal heart sounds.  Pulmonary:     Effort: Pulmonary effort is normal.     Breath sounds: Normal breath sounds.  Abdominal:     Palpations: Abdomen is soft.     Tenderness: There is no abdominal tenderness.  Musculoskeletal: Normal range of motion.     Comments: Normal strength arms and legs bilaterally.  Skin:    General: Skin is warm and dry.  Neurological:     Mental Status: She is alert and oriented to person, place, and time.     Sensory: No  sensory deficit.     Motor: No abnormal muscle tone.     Coordination: Coordination normal.     Comments: No pronator drift or ataxia.  No dysarthria, aphasia or nystagmus.  Mild decreased light touch sensation of the bilateral forehead, right cheek, right chin, left chin, right upper arm, right lower arm, and all the fingers of the right hand.  No other focal neurologic abnormality.  Psychiatric:        Mood and Affect: Mood normal.        Behavior: Behavior normal.        Thought Content: Thought content normal.        Judgment: Judgment normal.      ED Treatments / Results  Labs (all labs ordered are listed, but only abnormal results are displayed) Labs Reviewed  ETHANOL  PROTIME-INR  APTT  CBC  DIFFERENTIAL  COMPREHENSIVE METABOLIC PANEL  RAPID URINE DRUG SCREEN, HOSP PERFORMED  URINALYSIS, ROUTINE W REFLEX  MICROSCOPIC  VITAMIN B12  I-STAT CREATININE, ED  POC URINE PREG, ED    EKG None  Radiology No results found.  Procedures Procedures (including critical care time)  Medications Ordered in ED Medications - No data to display   Initial Impression / Assessment and Plan / ED Course  I have reviewed the triage vital signs and the nursing notes.  Pertinent labs & imaging results that were available during my care of the patient were reviewed by me and considered in my medical decision making (see chart for details).         Patient Vitals for the past 24 hrs:  BP Pulse Resp SpO2 Height Weight  04/20/19 1558 - 70 19 100 % - -  04/20/19 1530 (!) 89/72 74 12 100 % - -  04/20/19 1525 - - - - 5\' 3"  (1.6 m) 54.4 kg    At discharge- reevaluation with update and discussion. After initial assessment and treatment, an updated evaluation reveals she is comfortable and has no additional complaints.  Findings discussed and questions answered. Mancel Bale   Medical Decision Making: Nonspecific paresthesias, evaluated for CVA, imaging negative.  Doubt acute CVA, TIA or intracranial lesion.  Incidental dental pain with recent dental procedures.  Patient requires additional endodontic management.  No indication for hospitalization at this time.  CRITICAL CARE-no Performed by: Mancel Bale  Nursing Notes Reviewed/ Care Coordinated Applicable Imaging Reviewed Interpretation of Laboratory Data incorporated into ED treatment  The patient appears reasonably screened and/or stabilized for discharge and I doubt any other medical condition or other [facility] requiring further screening, evaluation, or treatment in the ED at this time prior to discharge.  Plan: Home Medications-usual home medications  Home Treatments-rest, gradual advance activity; return here if the recommended treatment, does not improve the symptoms; Recommended follow up-PCP, PRN   Final Clinical Impressions(s) / ED Diagnoses    Final diagnoses:  Paresthesia  Toothache    ED Discharge Orders    None       Mancel Bale, MD 04/21/19 1116

## 2020-02-01 ENCOUNTER — Other Ambulatory Visit: Payer: BLUE CROSS/BLUE SHIELD | Admitting: Advanced Practice Midwife

## 2020-02-22 ENCOUNTER — Telehealth: Payer: Self-pay | Admitting: Women's Health

## 2020-02-22 NOTE — Telephone Encounter (Signed)

## 2020-02-23 ENCOUNTER — Other Ambulatory Visit: Payer: BLUE CROSS/BLUE SHIELD | Admitting: Women's Health

## 2020-03-20 ENCOUNTER — Telehealth: Payer: Self-pay | Admitting: Women's Health

## 2020-03-20 NOTE — Telephone Encounter (Signed)

## 2020-03-25 ENCOUNTER — Other Ambulatory Visit: Payer: BLUE CROSS/BLUE SHIELD | Admitting: Women's Health

## 2020-03-26 ENCOUNTER — Ambulatory Visit
Admission: EM | Admit: 2020-03-26 | Discharge: 2020-03-26 | Disposition: A | Discharge status: Home / Self Care | Payer: BLUE CROSS/BLUE SHIELD | Attending: Emergency Medicine | Admitting: Emergency Medicine

## 2020-03-26 ENCOUNTER — Other Ambulatory Visit: Payer: Self-pay

## 2020-03-26 DIAGNOSIS — N3001 Acute cystitis with hematuria: Secondary | ICD-10-CM | POA: Diagnosis present

## 2020-03-26 LAB — POCT URINALYSIS DIP (MANUAL ENTRY)
Bilirubin, UA: NEGATIVE
Glucose, UA: NEGATIVE mg/dL
Ketones, POC UA: NEGATIVE mg/dL
Nitrite, UA: POSITIVE — AB
Protein Ur, POC: 100 mg/dL — AB
Spec Grav, UA: >=1.03 — AB (ref 1.010–1.025)
Urobilinogen, UA: 0.2 E.U./dL
pH, UA: 5.5 (ref 5.0–8.0)

## 2020-03-26 LAB — POCT URINE PREGNANCY: Preg Test, Ur: NEGATIVE

## 2020-03-26 MED ORDER — PHENAZOPYRIDINE HCL 100 MG PO TABS: 100.0000 mg | ORAL_TABLET | Freq: Three times a day (TID) | ORAL | 0 refills | Status: DC | PRN

## 2020-03-26 MED ORDER — NITROFURANTOIN MONOHYD MACRO 100 MG PO CAPS: 100.0000 mg | ORAL_CAPSULE | Freq: Two times a day (BID) | ORAL | 0 refills | Status: DC

## 2020-03-26 MED ORDER — NITROFURANTOIN MONOHYD MACRO 100 MG PO CAPS: 100.0000 mg | ORAL_CAPSULE | Freq: Two times a day (BID) | ORAL | 0 refills | Status: AC

## 2020-03-26 NOTE — ED Triage Notes (Signed)
Pt presents with c/o dysuria since Sunday as well as low back pain

## 2020-03-26 NOTE — ED Provider Notes (Signed)
[facility]   CC: Burning with urination  SUBJECTIVE:  Renee Fuller is a 44 y.o. female who complains of  dysuria and low back pain for the past 2 days.  Patient denies a precipitating event, recent sexual encounter, excessive caffeine intake.  Localizes the pain to the lower back.  Pain is intermittent and describes it as achy.  Has tried OTC medications without relief.  Symptoms are made worse with urination.  Admits to similar symptoms in the past.  Denies fever, chills, nausea, vomiting, abdominal pain, flank pain, abnormal vaginal discharge or bleeding, hematuria.    LMP: Patient's last menstrual period was 03/04/2020 (approximate).  ROS: As in HPI.  All other pertinent ROS negative.     Past Medical History:  Diagnosis Date   Abnormal mammogram    10 years ago, told to have biopsy but never done    Past Surgical History:  Procedure Laterality Date   TUBAL LIGATION     No Known Allergies No current facility-administered medications on file prior to encounter.   No current outpatient medications on file prior to encounter.   Social History   Socioeconomic History   Marital status: Divorced    Spouse name: Not on file   Number of children: Not on file   Years of education: Not on file   Highest education level: Not on file  Occupational History   Not on file  Tobacco Use   Smoking status: Current Every Day Smoker    Types: Cigarettes   Smokeless tobacco: Never Used  Substance and Sexual Activity   Alcohol use: No   Drug use: No   Sexual activity: Not on file  Other Topics Concern   Not on file  Social History Narrative   Not on file   Social Determinants of Health   Financial Resource Strain:    Difficulty of Paying Living Expenses:   Food Insecurity:    Worried About Charity fundraiser in the Last Year:    Arboriculturist in the Last Year:   Transportation Needs:    Film/video editor (Medical):    Lack of  Transportation (Non-Medical):   Physical Activity:    Days of Exercise per Week:    Minutes of Exercise per Session:   Stress:    Feeling of Stress :   Social Connections:    Frequency of Communication with Friends and Family:    Frequency of Social Gatherings with Friends and Family:    Attends Religious Services:    Active Member of Clubs or Organizations:    Attends Music therapist:    Marital Status:   Intimate Partner Violence:    Fear of Current or Ex-Partner:    Emotionally Abused:    Physically Abused:    Sexually Abused:    Family History  Family history unknown: Yes    OBJECTIVE:  Vitals:   03/26/20 0920  BP: 94/61  Pulse: 80  Resp: 18  Temp: 98 F (36.7 C)  SpO2: 96%   General appearance: AOx3 in no acute distress HEENT: NCAT.  Oropharynx clear.  Lungs: clear to auscultation bilaterally without adventitious breath sounds Heart: regular rate and rhythm.  Radial pulses 2+ symmetrical bilaterally Abdomen: soft; non-distended; no tenderness; bowel sounds present; no guarding or rebound tenderness Back: no CVA tenderness, right-sided low back pain Extremities: no edema; symmetrical with no gross deformities Skin: warm and dry Neurologic: Ambulates from chair to exam table without difficulty Psychological: alert and  cooperative; normal mood and affect  Labs Reviewed  POCT URINALYSIS DIP (MANUAL ENTRY) - Abnormal; Notable for the following components:      Result Value   Spec Grav, UA >=1.030 (*)    Blood, UA moderate (*)    Protein Ur, POC =100 (*)    Nitrite, UA Positive (*)    Leukocytes, UA Small (1+) (*)    All other components within normal limits  URINE CULTURE  POCT URINE PREGNANCY    ASSESSMENT & PLAN:  1. Acute cystitis with hematuria    POCT urine analysis was positive for nitrite, small amount of leukocyte and moderate amount of blood.  We will treat this patient for possible UTI.  Macrobid and Pyridium  will be  prescribed.  POCT pregnancy test was negative.  Meds ordered this encounter  Medications   DISCONTD: phenazopyridine (PYRIDIUM) 100 MG tablet    Sig: Take 1 tablet (100 mg total) by mouth 3 (three) times daily as needed for pain.    Dispense:  10 tablet    Refill:  0   DISCONTD: nitrofurantoin, macrocrystal-monohydrate, (MACROBID) 100 MG capsule    Sig: Take 1 capsule (100 mg total) by mouth 2 (two) times daily.    Dispense:  10 capsule    Refill:  0   DISCONTD: nitrofurantoin, macrocrystal-monohydrate, (MACROBID) 100 MG capsule    Sig: Take 1 capsule (100 mg total) by mouth 2 (two) times daily.    Dispense:  10 capsule    Refill:  0   DISCONTD: phenazopyridine (PYRIDIUM) 100 MG tablet    Sig: Take 1 tablet (100 mg total) by mouth 3 (three) times daily as needed for pain.    Dispense:  10 tablet    Refill:  0   nitrofurantoin, macrocrystal-monohydrate, (MACROBID) 100 MG capsule    Sig: Take 1 capsule (100 mg total) by mouth 2 (two) times daily for 5 days.    Dispense:  10 capsule    Refill:  0   phenazopyridine (PYRIDIUM) 100 MG tablet    Sig: Take 1 tablet (100 mg total) by mouth 3 (three) times daily as needed for pain.    Dispense:  10 tablet    Refill:  0    DISCHARGE INSTRUCTION                               Urine culture sent.  We will call you with the results.   Push fluids and get plenty of rest.   Take antibiotic as directed and to completion Take pyridium as prescribed and as needed for symptomatic relief Follow up with PCP if symptoms persists Return here or go to ER if you have any new or worsening symptoms such as fever, worsening abdominal pain, nausea/vomiting, flank pain, etc...  Outlined signs and symptoms indicating need for more acute intervention. Patient verbalized understanding. After Visit Summary given.     Durward Parcel, FNP 03/26/20 [phone]

## 2020-03-26 NOTE — Discharge Instructions (Addendum)
Urine culture sent.  We will call you with the results.   Push fluids and get plenty of rest.   Take antibiotic as directed and to completion Take pyridium as prescribed and as needed for symptomatic relief Follow up with PCP if symptoms persists Return here or go to ER if you have any new or worsening symptoms such as fever, worsening abdominal pain, nausea/vomiting, flank pain, etc... 

## 2020-03-28 LAB — URINE CULTURE: Culture: >=100000 — AB

## 2020-04-12 ENCOUNTER — Ambulatory Visit
Admission: EM | Admit: 2020-04-12 | Discharge: 2020-04-12 | Disposition: A | Discharge status: Home / Self Care | Payer: BLUE CROSS/BLUE SHIELD | Attending: Emergency Medicine | Admitting: Emergency Medicine

## 2020-04-12 ENCOUNTER — Other Ambulatory Visit: Payer: Self-pay

## 2020-04-12 DIAGNOSIS — F419 Anxiety disorder, unspecified: Secondary | ICD-10-CM | POA: Diagnosis not present

## 2020-04-12 MED ORDER — HYDROXYZINE HCL 50 MG PO TABS: 50.0000 mg | ORAL_TABLET | Freq: Three times a day (TID) | ORAL | 0 refills | Status: DC | PRN

## 2020-04-12 NOTE — ED Provider Notes (Signed)
[facility]    CSN: [id number] Arrival date & time: 04/12/20  1020      History   Chief Complaint Chief Complaint  Patient presents with  . Anxiety    HPI Renee Fuller is a 44 y.o. female.   Who presented to the urgent care with a complaint of anxiety for the past 2 weeks.  Reported she went to see her son in a jail last week and its trigger her anxiety.  Describes anxiety as heart palpitation.  Currently is not taking any medication, but denies relief in symptoms.  Symptoms are made worse as time goes by.  Reports similar symptoms in the past that improved with treatment but cant remember medication  used.  Denies HI or SI.  Patient denies fever, chills, anhedonia, difficulty sleeping, changes in normal activities, nausea, vomiting, chest pain, SOB, abdominal pain, changes in bowel or bladder habits.   The history is provided by the patient. No language interpreter was used.    Past Medical History:  Diagnosis Date  . Abnormal mammogram    10 years ago, told to have biopsy but never done     Patient Active Problem List   Diagnosis Date Noted  . Breast abscess 12/01/2018    Past Surgical History:  Procedure Laterality Date  . TUBAL LIGATION      OB History   No obstetric history on file.      Home Medications    Prior to Admission medications   Medication Sig Start Date End Date Taking? Authorizing Provider  hydrOXYzine (ATARAX/VISTARIL) 50 MG tablet Take 1 tablet (50 mg total) by mouth 3 (three) times daily as needed. 04/12/20   Jleigh Striplin, Zachery Dakins, FNP  phenazopyridine (PYRIDIUM) 100 MG tablet Take 1 tablet (100 mg total) by mouth 3 (three) times daily as needed for pain. 03/26/20   Trayce Maino, Zachery Dakins, FNP    Family History Family History  Family history unknown: Yes    Social History Social History   Tobacco Use  . Smoking status: Current Every Day Smoker    Types: Cigarettes  . Smokeless tobacco: Never Used  Substance Use Topics    . Alcohol use: No  . Drug use: No     Allergies   Patient has no known allergies.   Review of Systems Review of Systems  Constitutional: Negative.   Respiratory: Negative.   Cardiovascular: Negative.   Psychiatric/Behavioral: The patient is nervous/anxious.   All other systems reviewed and are negative.    Physical Exam Triage Vital Signs ED Triage Vitals  Enc Vitals Group     BP      Pulse      Resp      Temp      Temp src      SpO2      Weight      Height      Head Circumference      Peak Flow      Pain Score      Pain Loc      Pain Edu?      Excl. in GC?    No data found.  Updated Vital Signs BP 100/64   Pulse 90   Temp 98.1 F (36.7 C)   Resp 18   LMP 03/21/2020   SpO2 99%   Visual Acuity Right Eye Distance:   Left Eye Distance:   Bilateral Distance:    Right Eye Near:   Left Eye Near:    Bilateral Near:  Physical Exam Vitals and nursing note reviewed.  Constitutional:      General: She is not in acute distress.    Appearance: Normal appearance. She is normal weight. She is not ill-appearing, toxic-appearing or diaphoretic.  Cardiovascular:     Rate and Rhythm: Normal rate and regular rhythm.     Pulses: Normal pulses.     Heart sounds: Normal heart sounds. No murmur. No friction rub. No gallop.   Pulmonary:     Effort: Pulmonary effort is normal. No respiratory distress.     Breath sounds: Normal breath sounds. No stridor. No wheezing, rhonchi or rales.  Chest:     Chest wall: No tenderness.  Neurological:     Mental Status: She is alert.  Psychiatric:        Attention and Perception: Attention normal.        Mood and Affect: Mood is anxious.        Speech: Speech normal.        Behavior: Behavior normal. Behavior is cooperative.      UC Treatments / Results  Labs (all labs ordered are listed, but only abnormal results are displayed) Labs Reviewed - No data to display  EKG   Radiology No results  found.  Procedures Procedures (including critical care time)  Medications Ordered in UC Medications - No data to display  Initial Impression / Assessment and Plan / UC Course  I have reviewed the triage vital signs and the nursing notes.  Pertinent labs & imaging results that were available during my care of the patient were reviewed by me and considered in my medical decision making (see chart for details).    Patient is stable at discharge.  She was advised to follow-up with PCP.  To return or go to ED for worsening of symptoms.  Final Clinical Impressions(s) / UC Diagnoses   Final diagnoses:  Anxiety     Discharge Instructions     Rest and drink fluids Eat a well-balanced diet, and avoid excessive caffeine intake Continue with hydroxyzine nightly for symptom relief Some things you may try doing to help alleviate your symptoms include: keeping a journal, exercise, talking to a friend or relative, listening to music, going for a walk or hike outside, or other activities that you may find enjoyable Recommending further evaluation and management with PCP Return or go to ER if you have any new or worsening symptoms such as fever, chills, fatigue, worsening shortness of breath, wheezing, chest pain, nausea, vomiting, abdominal pain, changes in bowel or bladder habits, etc...     ED Prescriptions    Medication Sig Dispense Auth. Provider   hydrOXYzine (ATARAX/VISTARIL) 50 MG tablet Take 1 tablet (50 mg total) by mouth 3 (three) times daily as needed. 60 tablet Nakari Bracknell, Darrelyn Hillock, FNP     PDMP not reviewed this encounter.   Emerson Monte, FNP 04/12/20 1046

## 2020-04-12 NOTE — Discharge Instructions (Addendum)
Rest and drink fluids Eat a well-balanced diet, and avoid excessive caffeine intake Continue with hydroxyzine nightly for symptom relief Some things you may try doing to help alleviate your symptoms include: keeping a journal, exercise, talking to a friend or relative, listening to music, going for a walk or hike outside, or other activities that you may find enjoyable Recommending further evaluation and management with PCP Return or go to ER if you have any new or worsening symptoms such as fever, chills, fatigue, worsening shortness of breath, wheezing, chest pain, nausea, vomiting, abdominal pain, changes in bowel or bladder habits, etc..Marland Kitchen

## 2020-04-12 NOTE — ED Triage Notes (Signed)
Pt presents with c/o anxiety  That began about 2 weeks ago. Marland Kitchen

## 2020-09-18 IMAGING — MR MRI HEAD WITHOUT CONTRAST
8 of 10 series · 35 of 48 positions shown · non-contrast
Comparison: 03/28/2018 head CT

CLINICAL DATA: Right facial and upper extremity numbness

EXAM:
MRI HEAD WITHOUT CONTRAST
TECHNIQUE: Multiplanar, multiecho pulse sequences of the brain and surrounding
structures were obtained without intravenous contrast.

[Series 2: T1 · sagittal · 5.0mm · 0.41mm/px · 2 of 20 slices shown (1 of 2)]
[im 1/20]
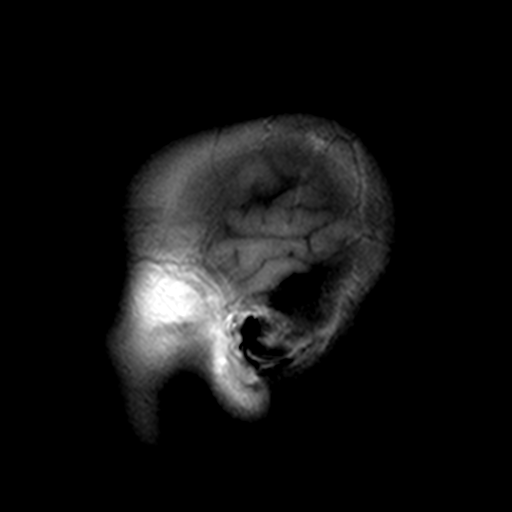
[im 20/20]
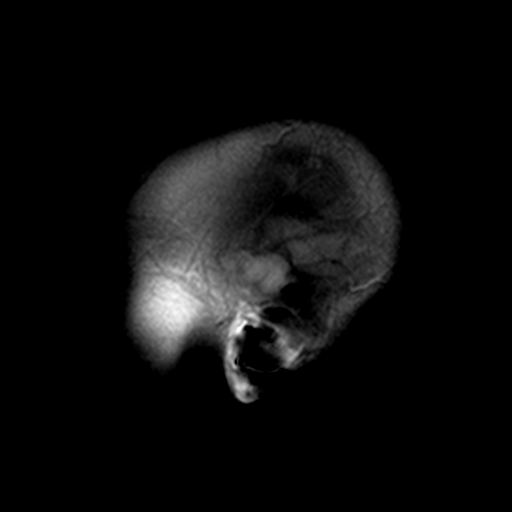

[Series 3: DWI · axial · 3.0mm · 0.77mm/px · z∈[-5,+155]mm · 7 of 55 slices shown (1 of 2)]
[im 1/55]
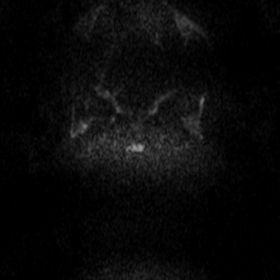
[im 10/55]
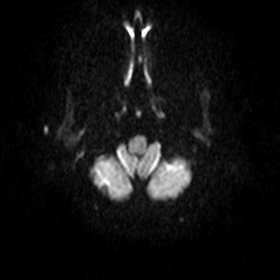
[im 19/55]
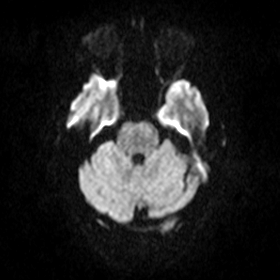
[im 28/55]
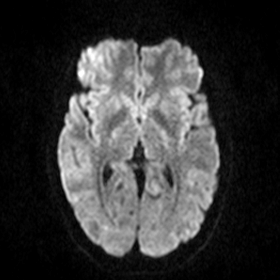
[im 37/55]
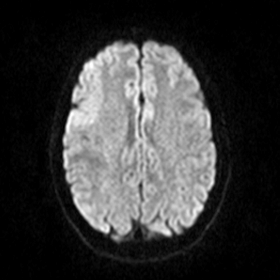
[im 46/55]
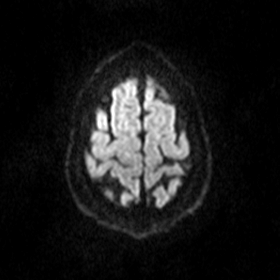
[im 55/55]
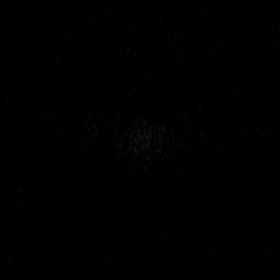

[Series 5: DWI · coronal · 5.0mm · 0.49mm/px · 4 of 34 slices shown (2 of 2)]
[im 1/34]
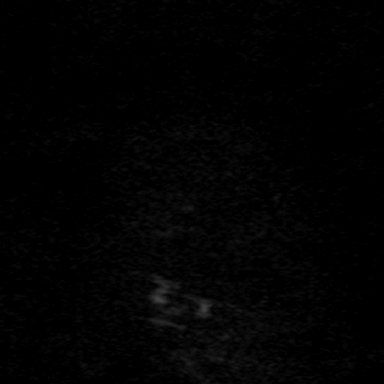
[im 12/34]
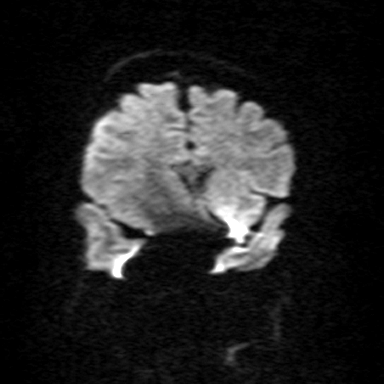
[im 23/34]
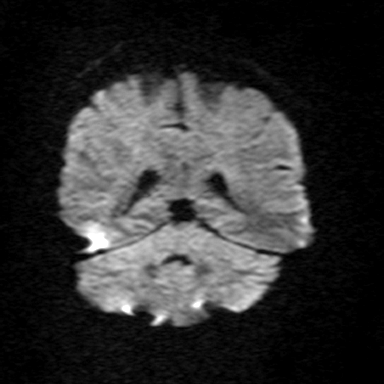
[im 34/34]
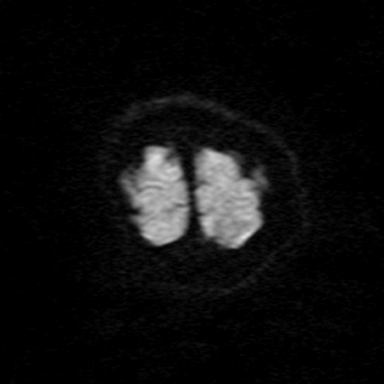

[Series 7: T2 · axial · 5.0mm · 0.69mm/px · z∈[+4,+146]mm · 3 of 23 slices shown (1 of 3)]
[im 1/23]
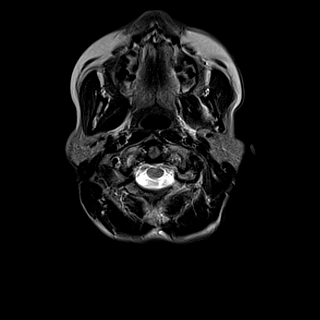
[im 12/23]
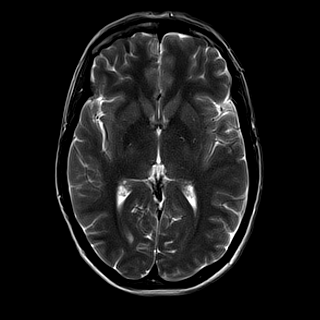
[im 23/23]
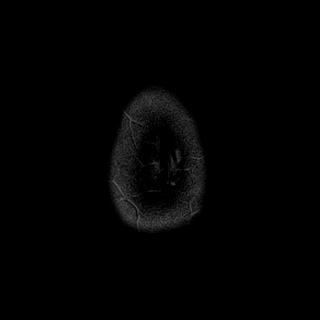

[Series 8: T2 · axial · 5.0mm · 0.42mm/px · z∈[+11,+140]mm · 3 of 21 slices shown (2 of 3)]
[im 1/21]
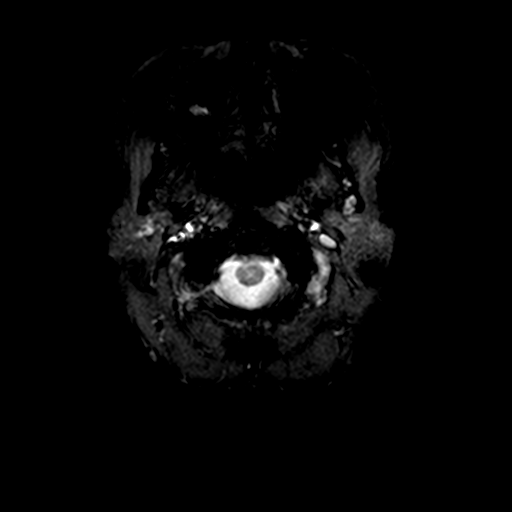
[im 11/21]
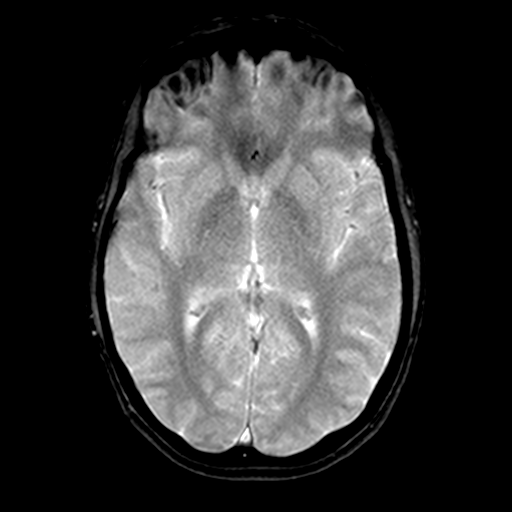
[im 21/21]
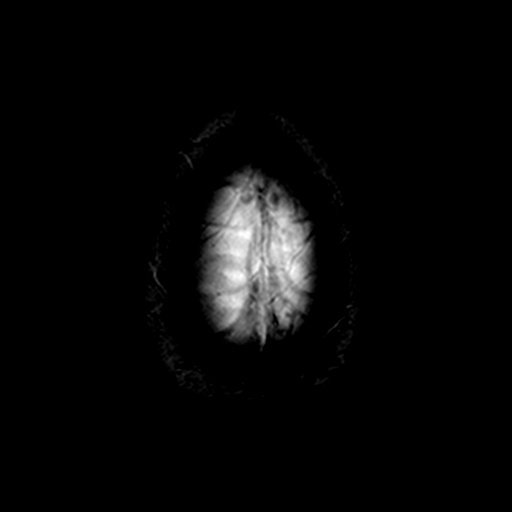

[Series 9: FLAIR · axial · 3.0mm · 0.86mm/px · z∈[+6,+143]mm · 6 of 47 slices shown]
[im 1/47]
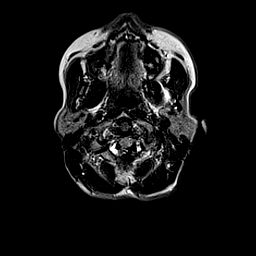
[im 10/47]
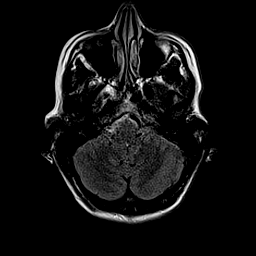
[im 19/47]
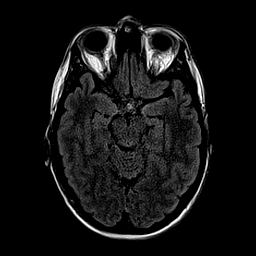
[im 28/47]
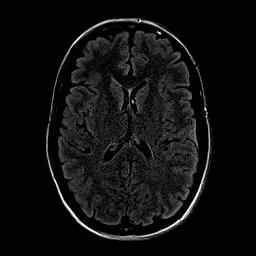
[im 37/47]
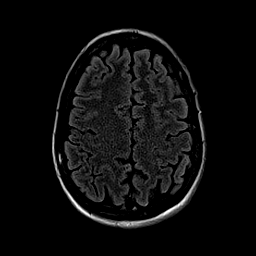
[im 47/47]
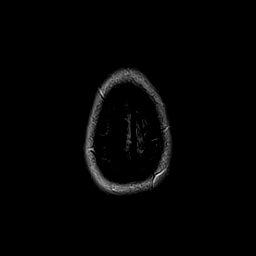

[Series 10: T1 · axial · 2.0mm · 0.42mm/px · z∈[+13,+136]mm · 7 of 72 slices shown (2 of 2)]
[im 1/72]
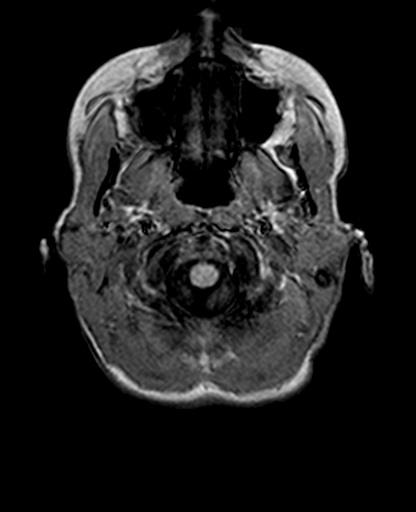
[im 9/72]
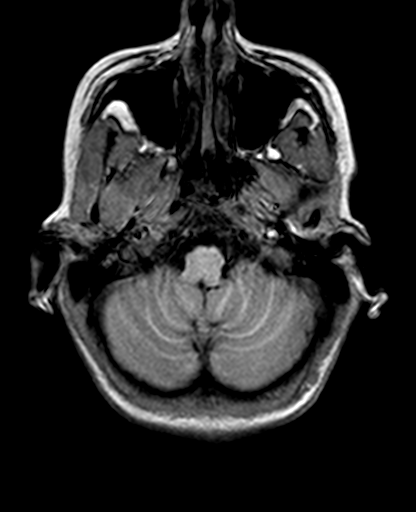
[im 18/72]
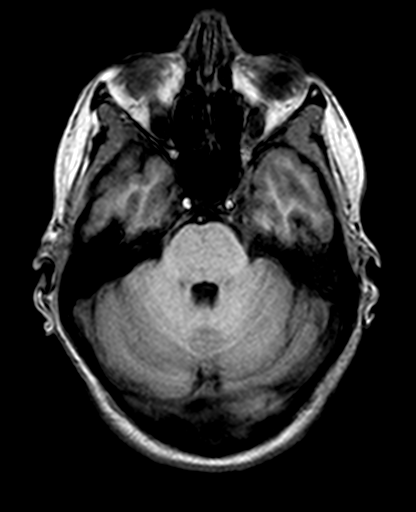
[im 27/72]
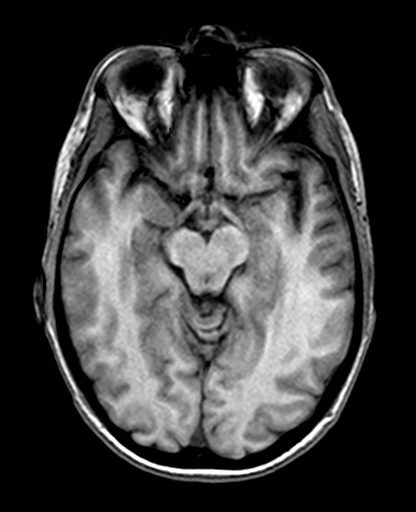
[im 45/72]
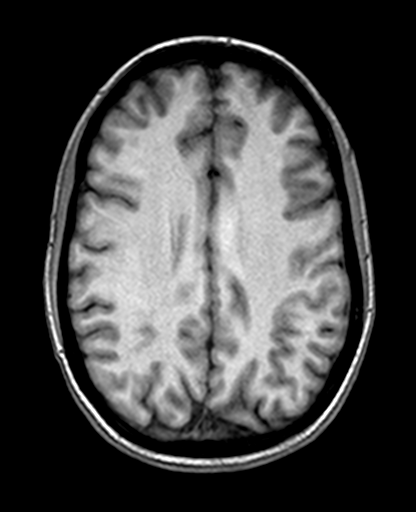
[im 54/72]
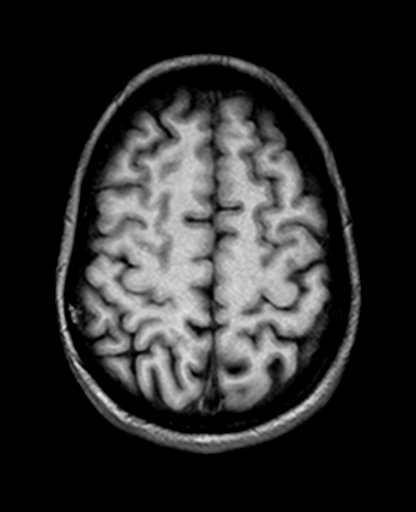
[im 63/72]
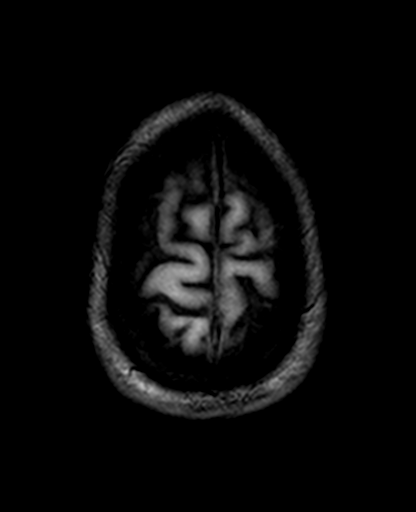

[Series 11: T2 · coronal · 5.0mm · 0.63mm/px · 3 of 28 slices shown (3 of 3)]
[im 1/28]
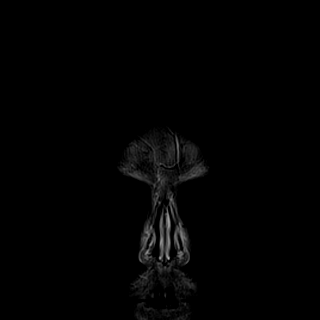
[im 14/28]
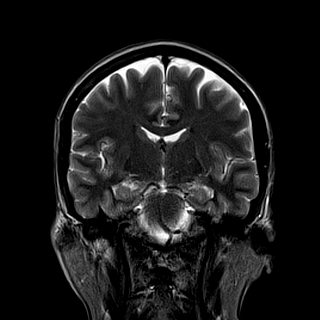
[im 28/28]
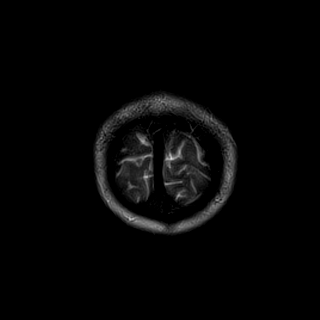

[35 of 48 positions shown; findings below may reference images not displayed]

FINDINGS: BRAIN: There is no acute infarct, acute hemorrhage or extra-axial
collection. The midline structures are normal. No midline shift or
other mass effect. The white matter signal is normal for the
patient's age. The cerebral and cerebellar volume are
age-appropriate. No hydrocephalus. Susceptibility-sensitive
sequences show no chronic microhemorrhage or superficial siderosis.
No mass lesion.

VASCULAR: The major intracranial arterial and venous sinus flow
voids are normal.

SKULL AND UPPER CERVICAL SPINE: Calvarial bone marrow signal is
normal. There is no skull base mass. Visualized upper cervical spine
and soft tissues are normal.

SINUSES/ORBITS: No fluid levels or advanced mucosal thickening. No
mastoid or middle ear effusion. The orbits are normal.
IMPRESSION: Normal brain.

## 2023-11-30 ENCOUNTER — Ambulatory Visit: Payer: Self-pay | Admitting: Obstetrics & Gynecology

## 2024-01-05 ENCOUNTER — Ambulatory Visit (INDEPENDENT_AMBULATORY_CARE_PROVIDER_SITE_OTHER): Payer: 59 | Admitting: Obstetrics & Gynecology

## 2024-01-05 ENCOUNTER — Other Ambulatory Visit (HOSPITAL_COMMUNITY)
Admission: RE | Admit: 2024-01-05 | Discharge: 2024-01-05 | Disposition: A | Discharge status: Home / Self Care | Payer: 59 | Source: Ambulatory Visit | Attending: Obstetrics & Gynecology | Admitting: Obstetrics & Gynecology

## 2024-01-05 ENCOUNTER — Encounter: Payer: Self-pay | Admitting: Obstetrics & Gynecology

## 2024-01-05 VITALS — BP 112/66 | HR 74 | Ht 63.0 in | Wt 130.0 lb

## 2024-01-05 DIAGNOSIS — Z113 Encounter for screening for infections with a predominantly sexual mode of transmission: Secondary | ICD-10-CM | POA: Diagnosis not present

## 2024-01-05 DIAGNOSIS — Z1151 Encounter for screening for human papillomavirus (HPV): Secondary | ICD-10-CM

## 2024-01-05 DIAGNOSIS — Z1211 Encounter for screening for malignant neoplasm of colon: Secondary | ICD-10-CM

## 2024-01-05 DIAGNOSIS — Z01419 Encounter for gynecological examination (general) (routine) without abnormal findings: Secondary | ICD-10-CM

## 2024-01-05 DIAGNOSIS — N6311 Unspecified lump in the right breast, upper outer quadrant: Secondary | ICD-10-CM

## 2024-01-05 DIAGNOSIS — Z1231 Encounter for screening mammogram for malignant neoplasm of breast: Secondary | ICD-10-CM

## 2024-01-05 NOTE — Progress Notes (Signed)
 WELL-WOMAN EXAMINATION Patient name: Renee Fuller MRN [id number]  Date of birth: 1976/07/08 Chief Complaint:   Gynecologic Exam  History of Present Illness:   Renee Fuller is a 48 y.o. G9P2002 female being seen today for a routine well-woman exam.   Menses are irregular- typically last 4-5 days and a week prior may have some spotting. Periods are each month, but not predictable.  Prior to her menses notes significant mood changes/swings.  She would prefer to avoid medication.  Seen by Dr. Cricket Doll- PCP started on Klonopin which she takes as needed and this seems to be working well for her  Denies vaginal discharge, itching, irritation.  Denies pelvic or abdominal pain. -Sexually active with same partner and does request STI screening  Right breast concerns: Notes a mole on her nipple and h/o breast cyst s/p drainage   Patient's last menstrual period was 12/28/2023. Denies issues with her menses The current method of family planning is tubal ligation.    Last pap due today.  Last mammogram: ordered. Last colonoscopy: reviewed screening guidelines      No data to display            Review of Systems:   Pertinent items are noted in HPI Denies any headaches, blurred vision, fatigue, shortness of breath, chest pain, abdominal pain, bowel movements, urination, or intercourse unless otherwise stated above.  Pertinent History Reviewed:  Reviewed past medical,surgical, social and family history.  Reviewed problem list, medications and allergies. Physical Assessment:   Vitals:   01/05/24 1126  BP: 112/66  Pulse: 74  Weight: 130 lb (59 kg)  Height: 5\' 3"  (1.6 m)  Body mass index is 23.03 kg/m.        Physical Examination:   General appearance - well appearing, and in no distress  Mental status - alert, oriented to person, place, and time  Psych:  She has a normal mood and affect  Skin - warm and dry, normal color, no suspicious lesions noted  Chest -  effort normal, all lung fields clear to auscultation bilaterally  Heart - normal rate and regular rhythm  Neck:  midline trachea, no thyromegaly or nodules  Breasts - Right breast with large ~ 3-4cm mobile mass with tenderness.  Additional density appreciated upper inner quadrant.  No nipple discharge.  Benign appearing mole.  Normal left breast.  No lymphadenopathy bilaterally.  Abdomen - soft, nontender, nondistended, no masses or organomegaly  Pelvic - VULVA: normal appearing vulva with no masses, tenderness or lesions  VAGINA: normal appearing vagina with normal color and discharge, no lesions  CERVIX: normal appearing cervix without discharge or lesions, no CMT  Thin prep pap is done with HR HPV cotesting  UTERUS: uterus is felt to be normal size, shape, consistency and nontender   ADNEXA: No adnexal masses or tenderness noted.  Extremities:  No swelling or varicosities noted  Chaperone: Lorean Rodes     Assessment & Plan:  1) Well-Woman Exam -pap collected, reviewed ASCCP guidelines -Discussed colon cancer screening, patient agreeable to Cologuard -STI screening ordered  2) right breast mass -Plan for diagnostic imaging  -Reviewed perimenopausal status  Orders Placed This Encounter  Procedures   MM 3D DIAGNOSTIC MAMMOGRAM BILATERAL BREAST   RPR   HIV Antibody (routine testing w rflx)   Hepatitis C antibody   Cologuard    Meds: No orders of the defined types were placed in this encounter.   Follow-up: Return in about 1 year (around 01/04/2025) for Annual.  Charlyne Robertshaw, DO Attending Obstetrician & Gynecologist, [facility] for [employer], [facility]

## 2024-01-06 LAB — HEPATITIS C ANTIBODY: Hep C Virus Ab: NONREACTIVE

## 2024-01-06 LAB — RPR: RPR Ser Ql: NONREACTIVE

## 2024-01-06 LAB — HIV ANTIBODY (ROUTINE TESTING W REFLEX): HIV Screen 4th Generation wRfx: NONREACTIVE

## 2024-01-07 LAB — CYTOLOGY - PAP
Adequacy: ABSENT
Chlamydia: NEGATIVE
Comment: NEGATIVE
Comment: NEGATIVE
Comment: NEGATIVE
Comment: NORMAL
Diagnosis: NEGATIVE
High risk HPV: NEGATIVE
Neisseria Gonorrhea: NEGATIVE
Trichomonas: NEGATIVE

## 2024-01-10 ENCOUNTER — Other Ambulatory Visit (HOSPITAL_COMMUNITY): Payer: Self-pay | Admitting: Obstetrics & Gynecology

## 2024-01-10 DIAGNOSIS — N63 Unspecified lump in unspecified breast: Secondary | ICD-10-CM

## 2024-02-22 ENCOUNTER — Ambulatory Visit (HOSPITAL_COMMUNITY)
Admission: RE | Admit: 2024-02-22 | Discharge: 2024-02-22 | Disposition: A | Discharge status: Home / Self Care | Payer: 59 | Source: Ambulatory Visit | Attending: Obstetrics & Gynecology | Admitting: Obstetrics & Gynecology

## 2024-02-22 DIAGNOSIS — N63 Unspecified lump in unspecified breast: Secondary | ICD-10-CM | POA: Insufficient documentation

## 2024-02-22 DIAGNOSIS — N6459 Other signs and symptoms in breast: Secondary | ICD-10-CM | POA: Diagnosis not present

## 2024-02-22 DIAGNOSIS — N6315 Unspecified lump in the right breast, overlapping quadrants: Secondary | ICD-10-CM | POA: Diagnosis not present

## 2024-02-22 DIAGNOSIS — N6311 Unspecified lump in the right breast, upper outer quadrant: Secondary | ICD-10-CM | POA: Diagnosis not present

## 2024-02-22 DIAGNOSIS — R92343 Mammographic extreme density, bilateral breasts: Secondary | ICD-10-CM | POA: Diagnosis not present

## 2024-02-22 DIAGNOSIS — R92323 Mammographic fibroglandular density, bilateral breasts: Secondary | ICD-10-CM | POA: Diagnosis not present

## 2024-02-23 ENCOUNTER — Encounter: Payer: Self-pay | Admitting: Obstetrics & Gynecology

## 2024-06-15 ENCOUNTER — Ambulatory Visit: Admitting: Obstetrics & Gynecology

## 2024-06-15 ENCOUNTER — Encounter: Payer: Self-pay | Admitting: Obstetrics & Gynecology

## 2024-06-15 VITALS — BP 103/62 | HR 90 | Ht 63.0 in | Wt 128.4 lb

## 2024-06-15 DIAGNOSIS — N611 Abscess of the breast and nipple: Secondary | ICD-10-CM | POA: Diagnosis not present

## 2024-06-15 MED ORDER — CEPHALEXIN 500 MG PO CAPS: 500.0000 mg | ORAL_CAPSULE | Freq: Four times a day (QID) | ORAL | 0 refills | Status: AC

## 2024-06-15 NOTE — Progress Notes (Signed)
   GYN VISIT Patient name: Renee Fuller MRN [id number]  Date of birth: 1976-08-13 Chief Complaint:   cyst on nipple  History of Present Illness:   Renee Fuller is a 48 y.o. [phone] female being seen today for the following concerns:  -Breast cyst: Cyst started on Monday- no fever/chills.  Super painful and started to have some drainage from nipple with white drainage.  And swollen LN. already using warm compresses.  Notes h/o breast cyst about 4 yrs ago- required I&D and she is hoping to avoid that    Patient's last menstrual period was 05/15/2024 (approximate).    Review of Systems:   Pertinent items are noted in HPI Denies fever/chills, dizziness, headaches, visual disturbances, fatigue, shortness of breath, chest pain, abdominal pain, vomiting Pertinent History Reviewed:   Past Surgical History:  Procedure Laterality Date   TUBAL LIGATION      Past Medical History:  Diagnosis Date   Abnormal mammogram    10 years ago, told to have biopsy but never done    Reviewed problem list, medications and allergies. Physical Assessment:   Vitals:   06/15/24 1032  BP: 103/62  Pulse: 90  Weight: 128 lb 6.4 oz (58.2 kg)  Height: 5' 3 (1.6 m)  Body mass index is 22.75 kg/m.       Physical Examination:   General appearance: alert, well appearing, and in no distress  Psych: mood appropriate, normal affect  Skin: warm & dry   Cardiovascular: normal heart rate noted  Respiratory: normal respiratory effort, no distress  Breast: Right breast with tender palpable approximately 3 cm mass inner quadrant immediately adjacent to nipple.  On today's exam unable to express discharge.  Right axilla with swollen node  Extremities: no edema   Chaperone: Declined    Assessment & Plan:  1) breast abscess - Keflex  4 times daily sent in - Patient to continue with warm compresses and if possible expression of discharge - If no improvement advised follow-up with urgent care  or possibly breast center  Meds ordered this encounter  Medications   cephALEXin  (KEFLEX ) 500 MG capsule    Sig: Take 1 capsule (500 mg total) by mouth 4 (four) times daily for 10 days.    Dispense:  21 capsule    Refill:  0      Return for as scheduled for annual.   Regginald Pask, DO Attending Obstetrician & Gynecologist, [facility] for [employer], [facility]

## 2024-08-17 ENCOUNTER — Telehealth: Payer: Self-pay | Admitting: Obstetrics & Gynecology

## 2024-08-17 DIAGNOSIS — N611 Abscess of the breast and nipple: Secondary | ICD-10-CM

## 2024-08-17 NOTE — Telephone Encounter (Signed)
 Patient is calling asking who does she need to see or be referral too to remove a cyst on her breast.

## 2024-08-29 NOTE — Telephone Encounter (Signed)
 Patient wanting referral for breast abscess.  Lives in [place] so would prefer to stay local if possible.  Will send to [facility].

## 2024-08-29 NOTE — Addendum Note (Signed)
 Addended by: ILEAN RUTHERFORD HERO on: 08/29/2024 02:06 PM   Modules accepted: Orders

## 2024-09-05 ENCOUNTER — Ambulatory Visit (INDEPENDENT_AMBULATORY_CARE_PROVIDER_SITE_OTHER): Payer: Self-pay | Admitting: General Surgery

## 2024-09-05 ENCOUNTER — Encounter: Payer: Self-pay | Admitting: General Surgery

## 2024-09-05 VITALS — BP 102/67 | HR 86 | Temp 97.8°F | Resp 14 | Ht 63.0 in | Wt 130.0 lb

## 2024-09-05 DIAGNOSIS — D485 Neoplasm of uncertain behavior of skin: Secondary | ICD-10-CM | POA: Insufficient documentation

## 2024-09-05 DIAGNOSIS — N611 Abscess of the breast and nipple: Secondary | ICD-10-CM | POA: Diagnosis not present

## 2024-09-05 DIAGNOSIS — L988 Other specified disorders of the skin and subcutaneous tissue: Secondary | ICD-10-CM | POA: Insufficient documentation

## 2024-09-05 NOTE — Patient Instructions (Signed)
 Excisional Breast Biopsy/ Lumpectomy  A lumpectomy, sometimes called a partial mastectomy, is surgery to remove a cancerous tumor or mass (the lump) from a breast. It is a form of breast-conserving or breast-preservation surgery. This means that the cancerous tissue is removed but the breast remains intact. During a lumpectomy, the portion of the breast that contains the tumor is removed. Lymph nodes under your arm may also be removed. Lymph nodes are part of the body's disease-fighting system (immune system) and are usually the first place where breast cancer spreads. Tell a health care provider about: Any allergies you have. All medicines you are taking, including vitamins, herbs, eye drops, creams, and over-the-counter medicines. Any problems you or family members have had with anesthetic medicines. Any bleeding problems you have. Any surgeries you have had. Any medical conditions you have. Whether you are pregnant or may be pregnant. What are the risks? Generally, this is a safe procedure. However, problems may occur, including: Bleeding. Infection. Allergic reaction to medicines. Pain, swelling, weakness, or numbness in the arm on the side of your surgery. Temporary swelling. Change in the shape of the breast, especially if a large portion is removed. Scar tissue that forms at the surgical site and feels hard to the touch. Blood clots. What happens before the procedure? Medicines Ask your health care provider about: Changing or stopping your regular medicines. These include any diabetes medicines or blood thinners you take. Taking medicines such as aspirin and ibuprofen . These medicines can thin your blood. Do not take them unless your health care provider tells you to. Taking over-the-counter medicines, vitamins, herbs, and supplements. Surgery safety Ask your health care provider how your surgery site will be marked. A procedure may be done to locate and mark the tumor area in  the breast (localization). This will guide the surgeon to where the incision will be made. This may be done with: Imaging, such as a mammogram, ultrasound, or MRI. Insertion of a small wire, clip, or seed, or an implant that will reflect a radar signal. Also, ask what steps will be taken to help prevent infection. These may include: Washing skin with a germ-killing soap. Taking antibiotic medicine. General instructions You may have screening tests or exams to get normal measurements of your arm, also called baseline measurements. These can be compared to measurements done after surgery to monitor for swelling (lymphedema) that can develop after having lymph nodes removed. If you will be going home right after the procedure, plan to have a responsible adult: Take you home from the hospital or clinic. You will not be allowed to drive. Care for you for the time you are told. What happens during the procedure?  An IV will be inserted into one of your veins. You may be given: A sedative. This helps you relax. Anesthesia. This will: Numb certain areas of your body. Make you fall asleep for surgery. An electric scalpel will be used to reduce bleeding (electrocautery knife). A curved incision that follows the natural curve of your breast will be made. The tumor will be removed along with some of the tissue around it. This will be sent to the lab for testing. Your health care provider may also remove lymph nodes at this time if needed. If the tumor is close to the muscles over your chest, some muscle tissue may also be removed. A small drain tube may be inserted into your breast area or armpit to collect fluid that may build up after surgery. This tube will  be connected to a suction bulb on the outside of your body to remove the fluid. The incision will be closed with stitches (sutures). A bandage (dressing) may be placed over the incision. The procedure may vary among health care providers and  hospitals. What happens after the procedure? Your blood pressure, heart rate, breathing rate, and blood oxygen level will be monitored until you leave the hospital or clinic. You will be given medicine for pain as needed. You will be encouraged to get up and walk as soon as you can. This will improve blood flow and breathing. Ask for help if you feel weak or unsteady. You may have a drain tube in place for 2-3 days to prevent a collection of blood (hematoma) from developing in the breast. You may have a pressure bandage applied for 1-2 days to prevent bleeding or swelling. Ask your health care provider how to care for your bandage at home. You may be given a tight sleeve to wear over your arm on the side of your surgery. Wear the sleeve as told by your health care provider. Do not drive or operate machinery until your health care provider says that it is safe. Where to find more information American Cancer Society: cancer.org National Cancer Institute: cancer.gov Summary A lumpectomy, sometimes called a partial mastectomy, is surgery to remove a cancerous tumor or mass (the lump) from a breast. During a lumpectomy, the portion of the breast that contains the tumor is removed. Plan to have someone take you home from the hospital or clinic. You may have a drain tube in place for 2-3 days to prevent a collection of blood (hematoma) from developing in the breast. This information is not intended to replace advice given to you by your health care provider. Make sure you discuss any questions you have with your health care provider.  Removing a Small Tissue of Skin for Testing (Skin Biopsy): What to Expect A skin biopsy is removing a small tissue of your skin so that it can be tested in the lab. This is usually done to diagnose skin conditions or abnormal changes on your skin (lesion). You may need skin biopsy if you have a skin disease or skin lesion. Tell a health care provider about: Any  allergies you have. All medicines you take. These include vitamins, herbs, eye drops, and creams. Any problems you or family members have had with anesthesia. Any bleeding problems you have. Any surgeries you've had. Any medical conditions you have. Whether you're pregnant or may be pregnant. What are the risks? Your provider will talk with you about risks. These may include: Bleeding. Infection. Scarring. Allergies to ointments, anesthesia, or materials used in surgery. What happens before? Medicines Ask about changing or stopping: Any medicines you take. Any vitamins, herbs, or supplements you take. Do not take aspirin or ibuprofen  unless you're told to. Surgery safety For your safety, you may: Need to wash your skin with a soap that kills germs. Get antibiotics. Have your surgery site marked. Have hair removed at the surgery site. General instructions Do not smoke, vape, or use nicotine or tobacco for at least 4 weeks before the surgery. Eat and drink only as told. Ask your health care provider if you'll need someone to take you home from the hospital or clinic. What happens during a skin biopsy?  You'll be given anesthesia to numb the area. Your provider will take a sample using one of these steps. This depends on the type of skin problem  that you have. Shave biopsy. Layers of skin lesion will be shaved away with a sharp blade. After shaving, a gel or ointment may be used to stop bleeding. Punch biopsy. All or part of the lesion will be removed using a surgical tool. This leaves a small hole. The hole may be covered with a gel or ointment. Excisional or incisional biopsy. All or part of the lesion will be removed using a surgical blade. Your skin biopsy site may be closed with stitches. A bandage may be put over the wound. These steps may vary. Ask what you can expect. What happens after? A sample will be sent to a lab for testing. Your skin biopsy site will be watched to  make sure that bleeding stops. Talk with your provider about your test results or treatment options. Ask if you need to have more tests. This information is not intended to replace advice given to you by your health care provider. Make sure you discuss any questions you have with your health care provider. Document Revised: 03/09/2024 Document Reviewed: 02/10/2024 Elsevier Patient Education  2025 Elsevier Inc. Document Revised: 03/02/2022 Document Reviewed: 02/15/2022 Elsevier Patient Education  2024 [employer].

## 2024-09-05 NOTE — Progress Notes (Signed)
 [facility] History and Physical  Reason for Referral:*** Referring Physician: ***  Chief Complaint   New Patient (Initial Visit)     Renee Fuller is a 48 y.o. female.  HPI:   Discussed the use of AI scribe software for clinical note transcription with the patient, who gave verbal consent to proceed.  History of Present Illness      ***.  The *** started *** and has had a duration of ***.  It is associated with ***.  The *** is improved with ***, and is made worse with ***.    Quality*** Context***  Past Medical History:  Diagnosis Date  . Abnormal mammogram    10 years ago, told to have biopsy but never done     Past Surgical History:  Procedure Laterality Date  . TUBAL LIGATION      Family History  Family history unknown: Yes    Social History   Tobacco Use  . Smoking status: Every Day    Types: Cigarettes  . Smokeless tobacco: Never  Vaping Use  . Vaping status: Never Used  Substance Use Topics  . Alcohol use: No  . Drug use: No    Medications: {medication reviewed/display:3041432} Allergies as of 09/05/2024   No Known Allergies      Medication List        Accurate as of September 05, 2024  9:04 AM. If you have any questions, ask your nurse or doctor.          STOP taking these medications    clonazePAM 1 MG tablet Commonly known as: KLONOPIN Stopped by: [name]         ROS:  {Review of Systems:30496}  Blood pressure 102/67, pulse 86, temperature 97.8 F (36.6 C), temperature source Oral, resp. rate 14, height 5' 3 (1.6 m), weight 130 lb (59 kg), SpO2 96%. Physical Exam Physical Exam   Results: CLINICAL DATA:  48 year old female with a palpable area of concern in the right breast.   EXAM: DIGITAL DIAGNOSTIC BILATERAL MAMMOGRAM WITH TOMOSYNTHESIS AND CAD; ULTRASOUND RIGHT BREAST LIMITED   TECHNIQUE: Bilateral digital diagnostic mammography and breast tomosynthesis was performed. The  images were evaluated with computer-aided detection. ; Targeted ultrasound examination of the right breast was performed   COMPARISON:  None available.   ACR Breast Density Category d: The breasts are extremely dense, which lowers the sensitivity of mammography.   FINDINGS: No suspicious masses or calcifications are seen in either breast. Spot compression tomograms were performed in the region of palpable concern in the outer right breast with no definite abnormality seen.   Physical examination at site of palpable concern reveals a mobile pea-sized mass in the outer right breast at the 9 o'clock position. The patient is reporting right nipple inversion, however physical examination reveals symmetric slightly inverted appearance of the bilateral nipples, likely physiologic.   Targeted ultrasound of the right breast was performed. Dense fibroglandular tissue is identified in the upper-outer quadrant of the right breast with a small benign 0.8 cm intramammary lymph node identified in the region of palpable concern. No suspicious masses or any other worrisome abnormality seen.   IMPRESSION: 1.  No findings of malignancy in either breast.   2. Mild bilateral nipple inversion, likely physiologic/within normal limits for this patient.   RECOMMENDATION: 1.  Screening mammogram in one year.(Code:SM-B-01Y)   2. Nipple inversion is felt to be likely related to a benign process given this is bilateral and otherwise normal in appearance  without underlying palpable masses. If the patient experiences a palpable mass, fixed nipple inversion or clear or bloody nipple discharge then further evaluation with breast MRI would be recommended.   I have discussed the findings and recommendations with the patient. If applicable, a reminder letter will be sent to the patient regarding the next appointment.   BI-RADS CATEGORY  2: Benign.     Electronically Signed   By: Delon Music M.D.   On:  02/22/2024 15:15   Assessment and Plan: Assessment and Plan Assessment & Plan      Renee Fuller is a 48 y.o. female with *** -*** -*** -Follow up ***  All questions were answered to the satisfaction of the patient and family***.  The risk and benefits of *** were discussed including but not limited to ***.  After careful consideration, Renee Fuller has decided to ***.    [name] 09/05/2024, 9:04 AM

## 2024-09-06 NOTE — H&P (Signed)
 [facility] History and Physical  Reason for Referral:*** Referring Physician: ***  Chief Complaint   New Patient (Initial Visit)     Renee Fuller is a 48 y.o. female.  HPI:   Discussed the use of AI scribe software for clinical note transcription with the patient, who gave verbal consent to proceed.  History of Present Illness      ***.  The *** started *** and has had a duration of ***.  It is associated with ***.  The *** is improved with ***, and is made worse with ***.    Quality*** Context***  Past Medical History:  Diagnosis Date  . Abnormal mammogram    10 years ago, told to have biopsy but never done     Past Surgical History:  Procedure Laterality Date  . TUBAL LIGATION      Family History  Family history unknown: Yes    Social History   Tobacco Use  . Smoking status: Every Day    Types: Cigarettes  . Smokeless tobacco: Never  Vaping Use  . Vaping status: Never Used  Substance Use Topics  . Alcohol use: No  . Drug use: No    Medications: {medication reviewed/display:3041432} Allergies as of 09/05/2024   No Known Allergies      Medication List        Accurate as of September 05, 2024  9:04 AM. If you have any questions, ask your nurse or doctor.          STOP taking these medications    clonazePAM 1 MG tablet Commonly known as: KLONOPIN Stopped by: [name]         ROS:  {Review of Systems:30496}  Blood pressure 102/67, pulse 86, temperature 97.8 F (36.6 C), temperature source Oral, resp. rate 14, height 5' 3 (1.6 m), weight 130 lb (59 kg), SpO2 96%. Physical Exam Physical Exam   Results: CLINICAL DATA:  48 year old female with a palpable area of concern in the right breast.   EXAM: DIGITAL DIAGNOSTIC BILATERAL MAMMOGRAM WITH TOMOSYNTHESIS AND CAD; ULTRASOUND RIGHT BREAST LIMITED   TECHNIQUE: Bilateral digital diagnostic mammography and breast tomosynthesis was performed. The  images were evaluated with computer-aided detection. ; Targeted ultrasound examination of the right breast was performed   COMPARISON:  None available.   ACR Breast Density Category d: The breasts are extremely dense, which lowers the sensitivity of mammography.   FINDINGS: No suspicious masses or calcifications are seen in either breast. Spot compression tomograms were performed in the region of palpable concern in the outer right breast with no definite abnormality seen.   Physical examination at site of palpable concern reveals a mobile pea-sized mass in the outer right breast at the 9 o'clock position. The patient is reporting right nipple inversion, however physical examination reveals symmetric slightly inverted appearance of the bilateral nipples, likely physiologic.   Targeted ultrasound of the right breast was performed. Dense fibroglandular tissue is identified in the upper-outer quadrant of the right breast with a small benign 0.8 cm intramammary lymph node identified in the region of palpable concern. No suspicious masses or any other worrisome abnormality seen.   IMPRESSION: 1.  No findings of malignancy in either breast.   2. Mild bilateral nipple inversion, likely physiologic/within normal limits for this patient.   RECOMMENDATION: 1.  Screening mammogram in one year.(Code:SM-B-01Y)   2. Nipple inversion is felt to be likely related to a benign process given this is bilateral and otherwise normal in appearance  without underlying palpable masses. If the patient experiences a palpable mass, fixed nipple inversion or clear or bloody nipple discharge then further evaluation with breast MRI would be recommended.   I have discussed the findings and recommendations with the patient. If applicable, a reminder letter will be sent to the patient regarding the next appointment.   BI-RADS CATEGORY  2: Benign.     Electronically Signed   By: Delon Music M.D.   On:  02/22/2024 15:15   Assessment and Plan: Assessment and Plan Assessment & Plan      Renee Fuller is a 48 y.o. female with *** -*** -*** -Follow up ***  All questions were answered to the satisfaction of the patient and family***.  The risk and benefits of *** were discussed including but not limited to ***.  After careful consideration, Renee Fuller has decided to ***.    [name] 09/05/2024, 9:04 AM  irregular borders  Discussed excision of the two moles and excision of the palpable mass in the right breast. Discussed risk of bleeding, infection, needing more surgery or work up, and discussed risk of breast infections with smoking.     All questions were answered to the satisfaction of the patient.    [name] 09/05/2024, 9:04 AM

## 2024-09-14 NOTE — Patient Instructions (Signed)
 Renee Fuller  09/14/2024     @PREFPERIOPPHARMACY @   Your procedure is scheduled on  09/19/2024.   Report to [facility] at  0600  A.M.   Call this number if you have problems the morning of surgery:  [phone]  If you experience any cold or flu symptoms such as cough, fever, chills, shortness of breath, etc. between now and your scheduled surgery, please notify us  at the above number.   Remember:  Do not eat after midnight.   You may drink clear liquids until 0330 am on 09/19/2024.      Clear liquids allowed are:                    Water, Juice (No red color; non-citric and without pulp; diabetics please choose diet or no sugar options), Carbonated beverages (diabetics please choose diet or no sugar options), Clear Tea (No creamer, milk, or cream, including half & half and powdered creamer), Black Coffee Only (No creamer, milk or cream, including half & half and powdered creamer), and Clear Sports drink (No red color; diabetics please choose diet or no sugar options)    Take these medicines the morning of surgery with A SIP OF WATER                                                    None.    Do not wear jewelry, make-up or nail polish, including gel polish,  artificial nails, or any other type of covering on natural nails (fingers and  toes).  Do not wear lotions, powders, or perfumes, or deodorant.  Do not shave 48 hours prior to surgery.  Men may shave face and neck.  Do not bring valuables to the hospital.  [facility] is not responsible for any belongings or valuables.  Contacts, dentures or bridgework may not be worn into surgery.  Leave your suitcase in the car.  After surgery it may be brought to your room.  For patients admitted to the hospital, discharge time will be determined by your treatment team.  Patients discharged the day of surgery will not be allowed to drive home and must have someone with them for 24 hours.    Special  instructions:  DO NOT smoke tobacco or vape for 24 hours before your procedure.  Please read over the following fact sheets that you were given. Coughing and Deep Breathing, Surgical Site Infection Prevention, Anesthesia Post-op Instructions, and Care and Recovery After Surgery      Incision Care, Adult An incision is a cut that a doctor makes in your skin for surgery. Most times, these cuts are closed after surgery. Your cut from surgery may be closed with: Stitches (sutures). Staples. Skin glue. Skin tape (adhesive) strips. You may need to go back to your doctor to have stitches or staples taken out. This may happen many days or many weeks after your surgery. You need to take good care of your cut so it does not get infected. Follow instructions from your doctor about how to care for your cut. Supplies needed: Soap and water. A clean hand towel. Wound cleanser. A clean bandage (dressing), if needed. Cream or ointment, if told by your doctor. Clean gauze. How to care for your cut from surgery Cleaning your  cut Ask your doctor how to clean your cut. You may need to: Wear medical gloves. Use mild soap and water, or a wound cleanser. Use a clean gauze to pat your cut dry after you clean it. Changing your bandage Wash your hands with soap and water for at least 20 seconds before and after you change your bandage. If you cannot use soap and water, use hand sanitizer. Do not usedisinfectants or antiseptics, such as rubbing alcohol, to clean your wound unless told by your doctor. Change your bandage as told by your doctor. Leavestitches or skin glue in place for at least 2 weeks. Leave tape strips alone unless you are told to take them off. You may trim the edges of the tape strips if they curl up. Put a cream or ointment on your cut. Do this only as told. Cover your cut with a clean bandage. Ask your doctor when you can leave your cut uncovered. Checking for infection Check your cut  area every day for signs of infection. Check for: More redness, swelling, or pain. More fluid or blood. New warmth. Hardness or a new rash around the incision. Pus or a bad smell.  Follow these instructions at home Medicines Take over-the-counter and prescription medicines only as told by your doctor. If you were prescribed an antibiotic medicine, cream, or ointment, use it as told by your doctor. Do not stop using the antibiotic even if you start to feel better. Eating and drinking Eat foods that have a lot of certain nutrients, such as protein, vitamin A, and vitamin C. These foods help your cut heal. Foods rich in protein include meat, fish, eggs, dairy, beans, nuts, and protein drinks. Foods rich in vitamin A include carrots and dark green, leafy vegetables. Foods rich in vitamin C include citrus fruits, tomatoes, broccoli, and peppers. Drink enough fluid to keep your pee (urine) pale yellow. General instructions  Do not take baths, swim, or use a hot tub. Ask your doctor about taking showers or sponge baths. Limit movement around your cut. This helps with healing. Try not to strain, lift, or exercise for the first 2 weeks, or for as long as told by your doctor. Return to your normal activities as told by your doctor. Ask your doctor what activities are safe for you. Do not scratch, scrub, or pick at your cut. Keep it covered as told by your doctor. Protect your cut from the sun when you are outside for the first 6 months, or for as long as told by your doctor. Cover up the scar area or put on sunscreen that has an SPF of at least 30. Do not use any products that contain nicotine or tobacco, such as cigarettes, e-cigarettes, and chewing tobacco. These can delay cut healing. If you need help quitting, ask your doctor. Keep all follow-up visits. Contact a doctor if: You have any of these signs of infection around your cut: More redness, swelling, or pain. More fluid or blood. New  warmth or hardness. Pus or a bad smell. A new rash. You have a fever. You feel like you may vomit (nauseous). You vomit. You are dizzy. Your stitches, staples, skin glue, or tape strips come undone. Your cut gets bigger. You have a fever. Get help right away if: Your cut bleeds through your bandage, and bleeding does not stop with gentle pressure. Your cut opens up and comes apart. These symptoms may be an emergency. Do not wait to see if the symptoms will go  away. Get medical help right away. Call your local emergency services (911 in the [place] Do not drive yourself to the hospital. Summary Follow instructions from your doctor about how to care for your cut. Wash your hands with soap and water for at least 20 seconds before and after you change your bandage. If you cannot use soap and water, use hand sanitizer. Check your cut area every day for signs of infection. Keep all follow-up visits. This information is not intended to replace advice given to you by your health care provider. Make sure you discuss any questions you have with your health care provider. Document Revised: 03/10/2021 Document Reviewed: 03/10/2021 Elsevier Patient Education  2024 [employer].Breast Biopsy, Care After The following information offers guidance on how to care for yourself after your breast biopsy. Your doctor may also give you more specific instructions. If you have problems or questions, contact your doctor. What can I expect after the procedure? After a breast biopsy, it is common to have: Bruising on your breast. Breast swelling. Numbness, tingling, or pain near your biopsy site. This site is where tissue was taken out for study. Follow these instructions at home: Medicines Take over-the-counter and prescription medicines only as told by your doctor. If you were given a sedative during your procedure, do not drive or use machines until your doctor says that it is safe. A sedative is a medicine  that helps you relax. Do not drink alcohol while taking pain medicine. Ask your doctor if you should avoid driving or using machines while you are taking your medicine. Biopsy site care     Follow instructions from your doctor about how to take care of your cut from surgery (incision) or your puncture site. Make sure you: Wash your hands with soap and water for at least 20 seconds before and after you change your bandage. If you cannot use soap and water, use hand sanitizer. Change your bandage. Leave stitches or skin glue in place for at least 2 weeks. Leave tape strips alone unless you are told to take them off. You may trim the edges of the tape strips if they curl up. If you have stitches, keep them dry when you take a bath or a shower. Check your cut or puncture site every day for signs of infection. Look for: More redness, swelling, or pain. More fluid or blood. Warmth. Pus or a bad smell. Protect the biopsy site. Do not let the site get bumped. Managing pain If told, put ice on the biopsy site. To do this: Put ice in a plastic bag. Place a towel between your skin and the bag. Leave the ice on for 20 minutes, 2-3 times a day. Take off the ice if your skin turns bright red. This is very important. If you cannot feel pain, heat, or cold, you have a greater risk of damage to the area. Activity If a cut was made in your skin to do the biopsy, avoid activities that could pull your cut open. These include: Stretching. Reaching over your head. Exercise. Sports. Lifting anything that weighs more than 3 lb (1.4 kg). Return to your normal activities when your doctor says that it is safe. General instructions Follow your normal diet. Wear a good support bra for as long as told by your doctor. Get checked for extra fluid around your lymph nodes (lymphedema) as often as told. Do not smoke or use any products that contain nicotine or tobacco. If you need help quitting, ask  your  doctor. Keep all follow-up visits. Contact a doctor if: You notice any of these at or near the biopsy site: More redness, swelling, or pain. More fluid or blood. Warmth. Pus or a bad smell. The site breaking open after the stitches or skin tape strips have been removed. You have a rash or a fever. Get help right away if: You have trouble breathing. You have red streaks around the biopsy site. Summary After a breast biopsy, it is common to have bruising, numbness, tingling, or pain near your biopsy site. Ask your doctor if you should avoid driving or using machines while you are taking your medicine. If you had a cut made in your skin to do the biopsy, avoid activities that may pull the cut open. Return to your normal activities when your doctor says that it is safe. Wear a good support bra for as long as told by your doctor. This information is not intended to replace advice given to you by your health care provider. Make sure you discuss any questions you have with your health care provider. Document Revised: 10/01/2021 Document Reviewed: 10/01/2021 Elsevier Patient Education  2024 Else[employer] Inc.General Anesthesia, Adult, Care After The following information offers guidance on how to care for yourself after your procedure. Your health care provider may also give you more specific instructions. If you have problems or questions, contact your health care provider. What can I expect after the procedure? After the procedure, it is common for people to: Have pain or discomfort at the IV site. Have nausea or vomiting. Have a sore throat or hoarseness. Have trouble concentrating. Feel cold or chills. Feel weak, sleepy, or tired (fatigue). Have soreness and body aches. These can affect parts of the body that were not involved in surgery. Follow these instructions at home: For the time period you were told by your health care provider:  Rest. Do not participate in activities where you  could fall or become injured. Do not drive or use machinery. Do not drink alcohol. Do not take sleeping pills or medicines that cause drowsiness. Do not make important decisions or sign legal documents. Do not take care of children on your own. General instructions Drink enough fluid to keep your urine pale yellow. If you have sleep apnea, surgery and certain medicines can increase your risk for breathing problems. Follow instructions from your health care provider about wearing your sleep device: Anytime you are sleeping, including during daytime naps. While taking prescription pain medicines, sleeping medicines, or medicines that make you drowsy. Return to your normal activities as told by your health care provider. Ask your health care provider what activities are safe for you. Take over-the-counter and prescription medicines only as told by your health care provider. Do not use any products that contain nicotine or tobacco. These products include cigarettes, chewing tobacco, and vaping devices, such as e-cigarettes. These can delay incision healing after surgery. If you need help quitting, ask your health care provider. Contact a health care provider if: You have nausea or vomiting that does not get better with medicine. You vomit every time you eat or drink. You have pain that does not get better with medicine. You cannot urinate or have bloody urine. You develop a skin rash. You have a fever. Get help right away if: You have trouble breathing. You have chest pain. You vomit blood. These symptoms may be an emergency. Get help right away. Call 911. Do not wait to see if the symptoms will go  away. Do not drive yourself to the hospital. Summary After the procedure, it is common to have a sore throat, hoarseness, nausea, vomiting, or to feel weak, sleepy, or fatigue. For the time period you were told by your health care provider, do not drive or use machinery. Get help right away if  you have difficulty breathing, have chest pain, or vomit blood. These symptoms may be an emergency. This information is not intended to replace advice given to you by your health care provider. Make sure you discuss any questions you have with your health care provider. Document Revised: 03/06/2022 Document Reviewed: 03/06/2022 Elsevier Patient Education  2024 Elsevier Inc.How to Use Chlorhexidine at Home in the Shower Chlorhexidine gluconate (CHG) is a germ-killing (antiseptic) wash that's used to clean the skin. It can get rid of the germs that normally live on the skin and can keep them away for about 24 hours. If you're having surgery, you may be told to shower with CHG at home the night before surgery. This can help lower your risk for infection. To use CHG wash in the shower, follow the steps below. Supplies needed: CHG body wash. Clean washcloth. Clean towel. How to use CHG in the shower Follow these steps unless you're told to use CHG in a different way: Start the shower. Use your normal soap and shampoo to wash your face and hair. Turn off the shower or move out of the shower stream. Pour CHG onto a clean washcloth. Do not use any type of brush or rough sponge. Start at your neck, washing your body down to your toes. Make sure you: Wash the part of your body where the surgery will be done for at least 1 minute. Do not scrub. Do not use CHG on your head or face unless your health care provider tells you to. If it gets into your ears or eyes, rinse them well with water. Do not wash your genitals with CHG. Wash your back and under your arms. Make sure to wash skin folds. Let the CHG sit on your skin for 1-2 minutes or as long as told. Rinse your entire body in the shower, including all body creases and folds. Turn off the shower. Dry off with a clean towel. Do not put anything on your skin afterward, such as powder, lotion, or perfume. Put on clean clothes or pajamas. If it's the  night before surgery, sleep in clean sheets. General tips Use CHG only as told, and follow the instructions on the label. Use the full amount of CHG as told. This is often one bottle. Do not smoke and stay away from flames after using CHG. Your skin may feel sticky after using CHG. This is normal. The sticky feeling will go away as the CHG dries. Do not use CHG: If you have a chlorhexidine allergy or have reacted to chlorhexidine in the past. On open wounds or areas of skin that have broken skin, cuts, or scrapes. On babies younger than 76 months of age. Contact a health care provider if: You have questions about using CHG. Your skin gets irritated or itchy. You have a rash after using CHG. You swallow any CHG. Call your local poison control center [phone] in the [place] Your eyes itch badly, or they become very red or swollen. Your hearing changes. You have trouble seeing. If you can't reach your provider, go to an urgent care or emergency room. Do not drive yourself. Get help right away if: You have swelling or  tingling in your mouth or throat. You make high-pitched whistling sounds when you breathe, most often when you breathe out (wheeze). You have trouble breathing. These symptoms may be an emergency. Call 911 right away. Do not wait to see if the symptoms will go away. Do not drive yourself to the hospital. This information is not intended to replace advice given to you by your health care provider. Make sure you discuss any questions you have with your health care provider. Document Revised: 06/22/2023 Document Reviewed: 06/18/2022 Elsevier Patient Education  2024 [employer].

## 2024-09-15 ENCOUNTER — Encounter (HOSPITAL_COMMUNITY)
Admission: RE | Admit: 2024-09-15 | Discharge: 2024-09-15 | Disposition: A | Discharge status: Home / Self Care | Payer: Self-pay | Source: Ambulatory Visit | Attending: General Surgery | Admitting: General Surgery

## 2024-09-15 ENCOUNTER — Encounter (HOSPITAL_COMMUNITY): Payer: Self-pay

## 2024-09-15 VITALS — BP 102/67 | HR 85 | Resp 16 | Ht 63.0 in | Wt 130.0 lb

## 2024-09-15 DIAGNOSIS — I517 Cardiomegaly: Secondary | ICD-10-CM | POA: Diagnosis not present

## 2024-09-15 DIAGNOSIS — F17200 Nicotine dependence, unspecified, uncomplicated: Secondary | ICD-10-CM | POA: Insufficient documentation

## 2024-09-15 DIAGNOSIS — Z01818 Encounter for other preprocedural examination: Secondary | ICD-10-CM | POA: Insufficient documentation

## 2024-09-15 DIAGNOSIS — R9431 Abnormal electrocardiogram [ECG] [EKG]: Secondary | ICD-10-CM | POA: Insufficient documentation

## 2024-09-15 LAB — POCT PREGNANCY, URINE: Preg Test, Ur: NEGATIVE

## 2024-09-18 NOTE — Anesthesia Preprocedure Evaluation (Signed)
 Anesthesia Evaluation  Patient identified by MRN, date of birth, ID band Patient awake    Reviewed: Allergy & Precautions, H&P , NPO status , Patient's Chart, lab work & pertinent test results, reviewed documented beta blocker date and time   Airway Mallampati: II  TM Distance: >3 FB Neck ROM: full    Dental no notable dental hx. (+) Dental Advisory Given, Teeth Intact   Pulmonary neg pulmonary ROS, Current Smoker   Pulmonary exam normal breath sounds clear to auscultation       Cardiovascular Exercise Tolerance: Good negative cardio ROS Normal cardiovascular exam Rhythm:regular Rate:Normal     Neuro/Psych negative neurological ROS  negative psych ROS   GI/Hepatic negative GI ROS, Neg liver ROS,,,  Endo/Other  negative endocrine ROS    Renal/GU negative Renal ROS  negative genitourinary   Musculoskeletal   Abdominal   Peds  Hematology negative hematology ROS (+)   Anesthesia Other Findings   Reproductive/Obstetrics negative OB ROS                              Anesthesia Physical Anesthesia Plan  ASA: 1  Anesthesia Plan: General   Post-op Pain Management: Dilaudid IV   Induction:   PONV Risk Score and Plan: Ondansetron , Dexamethasone, Midazolam and Scopolamine patch - Pre-op  Airway Management Planned: LMA  Additional Equipment: None  Intra-op Plan:   Post-operative Plan:   Informed Consent: I have reviewed the patients History and Physical, chart, labs and discussed the procedure including the risks, benefits and alternatives for the proposed anesthesia with the patient or authorized representative who has indicated his/her understanding and acceptance.     Dental Advisory Given  Plan Discussed with: CRNA  Anesthesia Plan Comments:          Anesthesia Quick Evaluation

## 2024-09-19 ENCOUNTER — Encounter (HOSPITAL_COMMUNITY): Payer: Self-pay | Admitting: General Surgery

## 2024-09-19 ENCOUNTER — Other Ambulatory Visit: Payer: Self-pay

## 2024-09-19 ENCOUNTER — Ambulatory Visit (HOSPITAL_COMMUNITY): Payer: Self-pay | Admitting: Anesthesiology

## 2024-09-19 ENCOUNTER — Ambulatory Visit (HOSPITAL_COMMUNITY)
Admission: RE | Admit: 2024-09-19 | Discharge: 2024-09-19 | Disposition: A | Discharge status: Home / Self Care | Payer: Self-pay | Attending: General Surgery | Admitting: General Surgery

## 2024-09-19 ENCOUNTER — Encounter (HOSPITAL_COMMUNITY)
Admission: RE | Disposition: A | Discharge status: Home / Self Care | Payer: Self-pay | Source: Home / Self Care | Attending: General Surgery

## 2024-09-19 DIAGNOSIS — F17210 Nicotine dependence, cigarettes, uncomplicated: Secondary | ICD-10-CM | POA: Diagnosis not present

## 2024-09-19 DIAGNOSIS — L988 Other specified disorders of the skin and subcutaneous tissue: Secondary | ICD-10-CM | POA: Diagnosis present

## 2024-09-19 DIAGNOSIS — N611 Abscess of the breast and nipple: Secondary | ICD-10-CM | POA: Diagnosis not present

## 2024-09-19 DIAGNOSIS — N6312 Unspecified lump in the right breast, upper inner quadrant: Secondary | ICD-10-CM | POA: Diagnosis not present

## 2024-09-19 DIAGNOSIS — C44501 Unspecified malignant neoplasm of skin of breast: Secondary | ICD-10-CM | POA: Diagnosis not present

## 2024-09-19 DIAGNOSIS — L820 Inflamed seborrheic keratosis: Secondary | ICD-10-CM | POA: Insufficient documentation

## 2024-09-19 DIAGNOSIS — C44511 Basal cell carcinoma of skin of breast: Secondary | ICD-10-CM | POA: Diagnosis not present

## 2024-09-19 DIAGNOSIS — D485 Neoplasm of uncertain behavior of skin: Secondary | ICD-10-CM | POA: Diagnosis not present

## 2024-09-19 HISTORY — PX: EXCISION OF BREAST BIOPSY: SHX5822

## 2024-09-19 HISTORY — PX: LESION REMOVAL: SHX5196

## 2024-09-19 SURGERY — EXCISION OF BREAST BIOPSY
Anesthesia: General | Site: Breast | Laterality: Right

## 2024-09-19 MED ORDER — ONDANSETRON HCL 4 MG/2ML IJ SOLN
INTRAMUSCULAR | Status: AC
Start: 2024-09-19 — End: 2024-09-19
  Filled 2024-09-19: qty 2

## 2024-09-19 MED ORDER — DEXAMETHASONE SODIUM PHOSPHATE 10 MG/ML IJ SOLN
INTRAMUSCULAR | Status: DC | PRN
  Administered 2024-09-19: 5 mg via INTRAVENOUS

## 2024-09-19 MED ORDER — MIDAZOLAM HCL 2 MG/2ML IJ SOLN
INTRAMUSCULAR | Status: DC | PRN
  Administered 2024-09-19: 2 mg via INTRAVENOUS

## 2024-09-19 MED ORDER — CHLORHEXIDINE GLUCONATE CLOTH 2 % EX PADS: 6.0000 | MEDICATED_PAD | Freq: Once | CUTANEOUS | Status: DC

## 2024-09-19 MED ORDER — PROPOFOL 10 MG/ML IV BOLUS
INTRAVENOUS | Status: DC | PRN
  Administered 2024-09-19: 120 mg via INTRAVENOUS

## 2024-09-19 MED ORDER — ACETAMINOPHEN 160 MG/5ML PO SOLN
960.0000 mg | Freq: Once | ORAL | Status: AC
Start: 2024-09-19 — End: 2024-09-19
  Filled 2024-09-19: qty 30

## 2024-09-19 MED ORDER — LACTATED RINGERS IV SOLN: INTRAVENOUS | Status: DC

## 2024-09-19 MED ORDER — CEFAZOLIN SODIUM-DEXTROSE 2-4 GM/100ML-% IV SOLN
2.0000 g | INTRAVENOUS | Status: AC
  Administered 2024-09-19: 2 g via INTRAVENOUS
  Filled 2024-09-19: qty 100

## 2024-09-19 MED ORDER — FENTANYL CITRATE (PF) 250 MCG/5ML IJ SOLN
INTRAMUSCULAR | Status: AC
  Filled 2024-09-19: qty 5

## 2024-09-19 MED ORDER — SODIUM CHLORIDE 0.9 % IV SOLN
12.5000 mg | Freq: Four times a day (QID) | INTRAVENOUS | Status: DC | PRN
  Administered 2024-09-19: 12.5 mg via INTRAVENOUS
  Filled 2024-09-19: qty 0.5

## 2024-09-19 MED ORDER — LIDOCAINE 2% (20 MG/ML) 5 ML SYRINGE
INTRAMUSCULAR | Status: DC | PRN
  Administered 2024-09-19: 60 mg via INTRAVENOUS

## 2024-09-19 MED ORDER — ONDANSETRON HCL 4 MG/2ML IJ SOLN
INTRAMUSCULAR | Status: DC | PRN
  Administered 2024-09-19: 4 mg via INTRAVENOUS

## 2024-09-19 MED ORDER — KETOROLAC TROMETHAMINE 30 MG/ML IJ SOLN
INTRAMUSCULAR | Status: AC
  Filled 2024-09-19: qty 1

## 2024-09-19 MED ORDER — 0.9 % SODIUM CHLORIDE (POUR BTL) OPTIME
TOPICAL | Status: DC | PRN
  Administered 2024-09-19: 500 mL

## 2024-09-19 MED ORDER — ORAL CARE MOUTH RINSE: 15.0000 mL | Freq: Once | OROMUCOSAL | Status: AC

## 2024-09-19 MED ORDER — OXYCODONE HCL 5 MG PO TABS: 5.0000 mg | ORAL_TABLET | ORAL | 0 refills | Status: DC | PRN

## 2024-09-19 MED ORDER — DEXAMETHASONE SODIUM PHOSPHATE 10 MG/ML IJ SOLN
INTRAMUSCULAR | Status: AC
  Filled 2024-09-19: qty 1

## 2024-09-19 MED ORDER — PROPOFOL 10 MG/ML IV BOLUS
INTRAVENOUS | Status: AC
  Filled 2024-09-19: qty 20

## 2024-09-19 MED ORDER — ONDANSETRON HCL 4 MG PO TABS: 4.0000 mg | ORAL_TABLET | Freq: Three times a day (TID) | ORAL | 1 refills | Status: DC | PRN

## 2024-09-19 MED ORDER — ACETAMINOPHEN 500 MG PO TABS
1000.0000 mg | ORAL_TABLET | Freq: Once | ORAL | Status: AC
  Administered 2024-09-19: 1000 mg via ORAL
  Filled 2024-09-19: qty 2

## 2024-09-19 MED ORDER — SCOPOLAMINE 1 MG/3DAYS TD PT72
1.0000 | MEDICATED_PATCH | Freq: Once | TRANSDERMAL | Status: DC
  Administered 2024-09-19: 1 mg via TRANSDERMAL
  Filled 2024-09-19: qty 1

## 2024-09-19 MED ORDER — MIDAZOLAM HCL 2 MG/2ML IJ SOLN
INTRAMUSCULAR | Status: AC
  Filled 2024-09-19: qty 2

## 2024-09-19 MED ORDER — BUPIVACAINE HCL (PF) 0.5 % IJ SOLN
INTRAMUSCULAR | Status: DC | PRN
  Administered 2024-09-19: 20 mL

## 2024-09-19 MED ORDER — BUPIVACAINE HCL (PF) 0.5 % IJ SOLN
INTRAMUSCULAR | Status: AC
  Filled 2024-09-19: qty 30

## 2024-09-19 MED ORDER — FENTANYL CITRATE (PF) 250 MCG/5ML IJ SOLN
INTRAMUSCULAR | Status: DC | PRN
  Administered 2024-09-19 (×2): 50 ug via INTRAVENOUS

## 2024-09-19 MED ORDER — LIDOCAINE 2% (20 MG/ML) 5 ML SYRINGE
INTRAMUSCULAR | Status: AC
  Filled 2024-09-19: qty 5

## 2024-09-19 MED ORDER — CHLORHEXIDINE GLUCONATE CLOTH 2 % EX PADS
6.0000 | MEDICATED_PAD | Freq: Once | CUTANEOUS | Status: DC
Start: 2024-09-19 — End: 2024-09-19

## 2024-09-19 MED ORDER — CHLORHEXIDINE GLUCONATE 0.12 % MT SOLN
15.0000 mL | Freq: Once | OROMUCOSAL | Status: AC
  Administered 2024-09-19: 15 mL via OROMUCOSAL
  Filled 2024-09-19: qty 15

## 2024-09-19 SURGICAL SUPPLY — 22 items
CHLORAPREP W/TINT 26 (MISCELLANEOUS) ×2 IMPLANT
CLOTH BEACON ORANGE TIMEOUT ST (SAFETY) ×2 IMPLANT
COVER LIGHT HANDLE (MISCELLANEOUS) IMPLANT
DERMABOND ADVANCED .7 DNX12 (GAUZE/BANDAGES/DRESSINGS) ×2 IMPLANT
ELECTRODE REM PT RTRN 9FT ADLT (ELECTROSURGICAL) ×2 IMPLANT
GAUZE SPONGE 4X4 12PLY STRL (GAUZE/BANDAGES/DRESSINGS) IMPLANT
GLOVE BIO SURGEON STRL SZ 6.5 (GLOVE) ×2 IMPLANT
GLOVE BIOGEL PI IND STRL 6.5 (GLOVE) ×2 IMPLANT
GLOVE BIOGEL PI IND STRL 7.0 (GLOVE) ×4 IMPLANT
GOWN STRL REUS W/TWL LRG LVL3 (GOWN DISPOSABLE) ×4 IMPLANT
KIT TURNOVER KIT A (KITS) ×2 IMPLANT
MANIFOLD NEPTUNE II (INSTRUMENTS) ×2 IMPLANT
NDL HYPO 21X1.5 SAFETY (NEEDLE) ×2 IMPLANT
NEEDLE HYPO 21X1.5 SAFETY (NEEDLE) ×2 IMPLANT
NS IRRIG 1000ML POUR BTL (IV SOLUTION) ×2 IMPLANT
PACK MINOR (CUSTOM PROCEDURE TRAY) ×2 IMPLANT
PAD ARMBOARD POSITIONER FOAM (MISCELLANEOUS) ×2 IMPLANT
POSITIONER HEAD 8X9X4 ADT (SOFTGOODS) ×2 IMPLANT
SET BASIN LINEN APH (SET/KITS/TRAYS/PACK) ×2 IMPLANT
SUT MNCRL AB 4-0 PS2 18 (SUTURE) ×2 IMPLANT
SUT VIC AB 3-0 SH 27X BRD (SUTURE) ×2 IMPLANT
SYR 30ML LL (SYRINGE) ×2 IMPLANT

## 2024-09-19 NOTE — Anesthesia Postprocedure Evaluation (Signed)
 Anesthesia Post Note  Patient: Renee Fuller  Procedure(s) Performed: EXCISION OF BREAST BIOPSY (Right: Breast) EXCISION, LESION, TORSO (Breast)  Patient location during evaluation: PACU Anesthesia Type: General Level of consciousness: awake and alert Pain management: pain level controlled Vital Signs Assessment: post-procedure vital signs reviewed and stable Respiratory status: spontaneous breathing, nonlabored ventilation and respiratory function stable Cardiovascular status: blood pressure returned to baseline and stable Postop Assessment: no apparent nausea or vomiting Anesthetic complications: no   There were no known notable events for this encounter.   Last Vitals:  Vitals:   09/19/24 0904 09/19/24 0916  BP: 116/64 111/62  Pulse: (!) 50 (!) 52  Resp: 12 16  Temp: 36.6 C 36.6 C  SpO2: 100% 100%    Last Pain:  Vitals:   09/19/24 0916  TempSrc: Oral  PainSc: 0-No pain                 Sadee Osland L Nabilah Davoli

## 2024-09-19 NOTE — Discharge Instructions (Signed)
 Discharge instructions after breast surgery:   Common Complaints: Pain and bruising at the incision sites.  Swelling at the incision sites.  Diet/ Activity: Diet as tolerated.  You may shower but do not take hot showers as this can disrupt the glue. Rest and listen to your body, but do not remain in bed all day.  Walk everyday for at least 15-20 minutes. Deep cough and move around every 1-2 hours in the first few days after surgery.  Do not do anything that makes you feel like you are putting unnecessary pull or stretch on the incision sites.  Do not pick at the dermabond glue on your incision sites.  This glue film will remain in place for 1-2 weeks and will start to peel off.  Do not place lotions or balms on your incision unless instructed to specifically by Dr. Kallie.   Pain Expectations and Narcotics: -After surgery you will have pain associated with your incisions and this is normal. The pain is muscular and nerve pain, and will get better with time. -You are encouraged and expected to take non narcotic medications like tylenol and ibuprofen  (when able) to treat pain as multiple modalities can aid with pain treatment. -Narcotics are only used when pain is severe or there is breakthrough pain. -You are not expected to have a pain score of 0 after surgery, as we cannot prevent pain. A pain score of 3-4 that allows you to be functional, move, walk, and tolerate some activity is the goal. The pain will continue to improve over the days after surgery and is dependent on your surgery. -Due to [place] law, we are only able to give a certain amount of pain medication to treat post operative pain, and we only give additional narcotics on a patient by patient basis.  -For most laparoscopic surgery, studies have shown that the majority of patients only need 10-15 narcotic pills, and for open surgeries most patients only need 15-20.   -Having appropriate expectations of pain and knowledge of pain  management with non narcotics is important as we do not want anyone to become addicted to narcotic pain medication.  -Using ice packs in the first 48 hours and heating pads after 48 hours, wearing an abdominal binder (when recommended), and using over the counter medications are all ways to help with pain management.   -Simple acts like meditation and mindfulness practices after surgery can also help with pain control and research has proven the benefit of these practices.  Medication: Take tylenol and ibuprofen  as needed for pain control, alternating every 4-6 hours.  Example:  Tylenol 1000mg  @ 6am, 12noon, 6pm, (Do not exceed 4000mg  of tylenol a day). Ibuprofen  800mg  @ 9am, 3pm, 9pm, 3am (Do not exceed 3600mg  of ibuprofen  a day).  Take Roxicodone for breakthrough pain every 4 hours.  Take Colace for constipation related to narcotic pain medication. If you do not have a bowel movement in 2 days, take Miralax over the counter.  Drink plenty of water to also prevent constipation.   Contact Information: If you have questions or concerns, please call our office, [phone], Monday- Thursday 8AM-5PM and Friday 8AM-12Noon.  If it is after hours or on the weekend, please call [facility] Main Number, [phone], [phone]  and ask to speak to the surgeon on call for Dr. Kallie at [facility].

## 2024-09-19 NOTE — Interval H&P Note (Signed)
 History and Physical Interval Note:  09/19/2024 7:27 AM  Renee Fuller  has presented today for surgery, with the diagnosis of RECURRENT BREAST ABSCESSES, RIGHT BREAST PALPABLE BREAST MASS, 3 OCLOCK, RIGHT BREAST NEOPLASM, RIGHT BREAST NEOPLASM, PUBIC.  The various methods of treatment have been discussed with the patient and family. After consideration of risks, benefits and other options for treatment, the patient has consented to  Procedure(s) with comments: EXCISION OF BREAST BIOPSY (Right) EXCISION, LESION, TORSO (N/A) - SKIN LESION RIGHT BREAST SKIN LESION PUBIC as a surgical intervention.  The patient's history has been reviewed, patient examined, no change in status, stable for surgery.  I have reviewed the patient's chart and labs.  Questions were answered to the patient's satisfaction.    Marked  [name]

## 2024-09-19 NOTE — Progress Notes (Signed)
 Patient having nausea around 10:15 am.  Phenergan 12.5 mg given IV.  Nausea much improved.

## 2024-09-19 NOTE — Transfer of Care (Signed)
 Immediate Anesthesia Transfer of Care Note  Patient: Renee Fuller  Procedure(s) Performed: EXCISION OF BREAST BIOPSY (Right: Breast) EXCISION, LESION, TORSO (Breast)  Patient Location: PACU  Anesthesia Type:General  Level of Consciousness: drowsy  Airway & Oxygen Therapy: Patient Spontanous Breathing and Patient connected to face mask oxygen  Post-op Assessment: Report given to RN and Post -op Vital signs reviewed and stable  Post vital signs: Reviewed and stable  Last Vitals:  Vitals Value Taken Time  BP 119/71   Temp 97.5   Pulse 55   Resp 13   SpO2 100%     Last Pain:  Vitals:   09/19/24 0651  TempSrc:   PainSc: 0-No pain         Complications: No notable events documented.

## 2024-09-19 NOTE — Progress Notes (Signed)
 [facility]  Updated her sister. Will send in Rx. Will see for wound check 10/15.  Manuelita Pander, MD [facility] [address] [place], [place] [address] [phone] (office)

## 2024-09-19 NOTE — Anesthesia Procedure Notes (Signed)
 Procedure Name: LMA Insertion Date/Time: 09/19/2024 7:37 AM  Performed by: Elaine Delon CROME, CRNAPre-anesthesia Checklist: Emergency Drugs available, Patient identified, Suction available and Patient being monitored Patient Re-evaluated:Patient Re-evaluated prior to induction Oxygen Delivery Method: Circle system utilized Preoxygenation: Pre-oxygenation with 100% oxygen Induction Type: IV induction LMA: LMA inserted LMA Size: 3.0 Number of attempts: 1 Placement Confirmation: positive ETCO2 and breath sounds checked- equal and bilateral Tube secured with: Tape Dental Injury: Teeth and Oropharynx as per pre-operative assessment

## 2024-09-19 NOTE — Op Note (Addendum)
 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 Kallie, MD   Assistants: No qualified resident was available    Anesthesia: General    Anesthesiologist: Landry Dunnings, MD    Specimens: Right breast skin neoplasm 11 o'clock 2mm, Suprapubic skin neoplasm 6mm; Right breast tissue /recurrent abscess    Estimated Blood Loss: Minimal   Blood Replacement: None    Complications: None   Wound Class: Clean    Operative Indications: Ms. Dilday is a 48 yo with normal mammogram but a recurrent abscess at the 3 o'clock position on the right breast with purulence expressed through that nipple when she was infected. She also had 2 skin lesions we wanted removed as they had been changing and increasing in size.   Findings: No active infection noted    Procedure: The patient was taken to the operating room and placed supine. General anesthesia was induced. Intravenous antibiotics were administered per protocol.  The right breast and suprapubic region were prepped and draped in the usual sterile fashion.   An elliptical incision was made around the suprapubic skin lesion (6mm). This was removed as a full thickness biopsy.  Marcaine was injected. The skin was closed with 4-0 Monocryl and dermabond.  The pathology was sent.   Attention was turned to the right breast. The skin lesion at 11 o'clock (2mm) was excised full thickness with a scalpel and sent to pathology.  At the 3 o'clock position, the area of prior infection was palpated and was slightly indurated but was improved from our prior exam. The size was about  1.5cm in size. An elliptical incision was made in the skin where the prior abscess has been.  The breast tissue just under the skin was excised and was about the size of a gumball ~1.5cm. There was no active signs of infection or purulence. Marcaine was injected.    The cavity was made hemostatic. The cavity was closed with 3-0 Vicryl. The skin was closed with 4-0 Monocryl and dermabond.  The skin excision was closed with dermabond.   Final inspection revealed acceptable hemostasis. All counts were correct at the end of the case. The patient was awakened from anesthesia and extubated without complication.  The patient went to the PACU in stable condition.   Manuelita Kallie, MD [facility] [address] [place], [place] [address] [phone] (office)

## 2024-09-20 ENCOUNTER — Ambulatory Visit: Payer: Self-pay | Admitting: General Surgery

## 2024-09-20 LAB — SURGICAL PATHOLOGY

## 2024-09-20 NOTE — Progress Notes (Signed)
 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 The 3 o'clock breast area was consistent with abscess.  We can discuss the additional skin excision at her post op appt.

## 2024-09-22 ENCOUNTER — Encounter (HOSPITAL_COMMUNITY): Payer: Self-pay | Admitting: Emergency Medicine

## 2024-09-22 ENCOUNTER — Emergency Department (HOSPITAL_COMMUNITY)
Admission: EM | Admit: 2024-09-22 | Discharge: 2024-09-22 | Disposition: A | Discharge status: Home / Self Care | Attending: Emergency Medicine | Admitting: Emergency Medicine

## 2024-09-22 ENCOUNTER — Other Ambulatory Visit: Payer: Self-pay

## 2024-09-22 DIAGNOSIS — L7621 Postprocedural hemorrhage and hematoma of skin and subcutaneous tissue following a dermatologic procedure: Secondary | ICD-10-CM | POA: Diagnosis not present

## 2024-09-22 DIAGNOSIS — T8131XA Disruption of external operation (surgical) wound, not elsewhere classified, initial encounter: Secondary | ICD-10-CM | POA: Diagnosis not present

## 2024-09-22 DIAGNOSIS — T8130XA Disruption of wound, unspecified, initial encounter: Secondary | ICD-10-CM

## 2024-09-22 NOTE — Discharge Instructions (Signed)
 Your wound site has been sealed with additional Dermabond.  I do recommend wearing a bandage or dressing over it so that the wound does not rub against your bra as discussed.  Plan to see Dr. Kallie as needed.

## 2024-09-22 NOTE — ED Triage Notes (Signed)
 Patient coming to ED for evaluation of bleeding to surgical site on R breast.  States she had a cyst on breast that was removed on Tuesday.  Went back to work today and noticed bleeding after returning home.  Did avoid lifting anything with R arm.  States site did not have sutures, only glue.  No fevers.

## 2024-09-22 NOTE — ED Notes (Signed)
 Dressing applied to pt's right areola area, area not currently bleeding

## 2024-09-25 NOTE — ED Provider Notes (Signed)
 [place] [facility] AT [facility] Provider Note   CSN: [id number] Arrival date & time: 09/22/24  1641     Patient presents with: Post-op Problem   Renee Fuller is a 48 y.o. female presenting for evaluation of bleeding from her right breast surgical site while at work today.  She had a biopsy of a skin lesion of her right breast and also had excision of a abscess pocket 3 days ago under the care of Dr. Kallie.  She did not have active infection at this abscess site but it had multiple infections, therefore the site was being excised for preventative treatment.  Both lesions were sealed with Dermabond and the lower edge of the Dermabond from the abscess site started bleeding today.  She denies any complaints of significant pain, there has been no purulent discharge, no skin erythema, no other complaints at this time.  She is scheduled to see Dr. Kallie next week in follow-up.   The history is provided by the patient.       Prior to Admission medications   Medication Sig Start Date End Date Taking? Authorizing Provider  Cyanocobalamin (VITAMIN B-12 PO) Take 1 tablet by mouth 2 (two) times a week.    [provider]  ibuprofen  (ADVIL ) 200 MG tablet Take 400 mg by mouth every 8 (eight) hours as needed (pain.).    [provider]  ondansetron  (ZOFRAN ) 4 MG tablet Take 1 tablet (4 mg total) by mouth every 8 (eight) hours as needed. 09/19/24 09/19/25  Kallie Manuelita BROCKS, MD  oxyCODONE (ROXICODONE) 5 MG immediate release tablet Take 1 tablet (5 mg total) by mouth every 4 (four) hours as needed for severe pain (pain score 7-10) or breakthrough pain. 09/19/24 09/19/25  Kallie Manuelita BROCKS, MD    Allergies: Patient has no known allergies.    Review of Systems  Constitutional:  Negative for chills and fever.  Skin:  Positive for wound.  All other systems reviewed and are negative.   Updated Vital Signs BP 110/70 (BP Location: Left Arm)   Pulse 87    Temp 98 F (36.7 C) (Oral)   Resp 15   Ht 5' 3 (1.6 m)   Wt 59 kg   LMP 09/01/2024 (Exact Date) Comment: Urine Pregnancy Test Negative as of 09/15/24.  SpO2 100%   BMI 23.03 kg/m   Physical Exam Constitutional:      Appearance: She is well-developed.  HENT:     Head: Normocephalic.  Cardiovascular:     Rate and Rhythm: Normal rate.  Pulmonary:     Effort: Pulmonary effort is normal.  Chest:     Comments: There is a well-healing mole excision site at the 12 o'clock position superior to right nipple, the 1 to 3 o'clock position is a site of abscess pocket excision, the inferior edge of the Dermabond appears to have failed.  There is no active bleeding at this time. Skin:    Findings: Laceration present.  Neurological:     Mental Status: She is alert and oriented to person, place, and time.     Sensory: No sensory deficit.     (all labs ordered are listed, but only abnormal results are displayed) Labs Reviewed - No data to display  EKG: None  Radiology: No results found.   Procedures    After cleaning the site using alcohol swab and sterile 4 x 4's, additional Dermabond layers were applied to the site of dehiscence and completely sealed the surgical edges.  Medications Ordered in the ED - No data to display                                  Medical Decision Making Patient presenting with a minimal dehiscence from an abscess pocket excision right breast.  It bled copiously prior to arrival but was well-controlled by the time of my exam.  Patient tolerated reapplication of Dermabond without complaint.  She was advised she may want to apply a Telfa pad over her wound which may help prevent rubbing against her bra which may have led to the Dermabond failure.  Plan follow-up with Dr. Kallie as originally scheduled.        Final diagnoses:  Wound dehiscence    ED Discharge Orders     None          Birdena Mliss RIGGERS 09/25/24 2010    Elnor Savant A,  DO 09/28/24 2344

## 2024-09-28 ENCOUNTER — Ambulatory Visit: Admitting: General Surgery

## 2024-09-28 ENCOUNTER — Encounter: Payer: Self-pay | Admitting: General Surgery

## 2024-09-28 VITALS — BP 103/69 | HR 73 | Temp 97.5°F | Resp 12 | Ht 63.0 in | Wt 128.0 lb

## 2024-09-28 DIAGNOSIS — N611 Abscess of the breast and nipple: Secondary | ICD-10-CM

## 2024-09-28 DIAGNOSIS — T8131XA Disruption of external operation (surgical) wound, not elsewhere classified, initial encounter: Secondary | ICD-10-CM | POA: Insufficient documentation

## 2024-09-28 MED ORDER — OXYCODONE HCL 5 MG PO TABS: 5.0000 mg | ORAL_TABLET | ORAL | 0 refills | Status: DC | PRN

## 2024-09-28 MED ORDER — DOXYCYCLINE HYCLATE 50 MG PO CAPS: 50.0000 mg | ORAL_CAPSULE | Freq: Two times a day (BID) | ORAL | 0 refills | Status: AC

## 2024-09-28 NOTE — Patient Instructions (Addendum)
 Your incision opened up at the right breast (wound dehiscence) it is draining some old blood. To ensure this does not get infected I will prescribe some antibiotics and have you pack the area daily.  I would not suggest putting glue back on a wound that has opened up.   To pack the area, remove the old gauze sticking out of the incision, and replace it using a q tip. You can take pain medication before you remove the gauze that is in place now. Once it is packed again, you can cover the area with gauze and papertape. Shower per regular routine with the bandage on and replace the bandage after showering versus taking a bath/ birdbath.  If you do not think you can pack the wound, please call the office and come to the office before 11:00AM tomorrow. They can teach you how to pack the area.    You can take tylenol, ibuprofen  or roxicodone for the pain medication.

## 2024-09-28 NOTE — Progress Notes (Signed)
 [facility]  Having bloody drainage. Went to the ED Friday and had her incision glued back as the area had dehisced.    BP 103/69   Pulse 73   Temp (!) 97.5 F (36.4 C) (Oral)   Resp 12   Ht 5' 3 (1.6 m)   Wt 128 lb (58.1 kg)   LMP 09/01/2024 (Exact Date) Comment: Urine Pregnancy Test Negative as of 09/15/24.  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 She has a small wound dehiscence and draining from the area. Discussed that with dehiscence I would not put any glue on the area or put it back together.   Daily packing and doxycycline. Roxicodone sent in for pain  To pack the area, remove the old gauze sticking out of the incision, and replace it using a q tip. You can take pain medication before you remove the gauze that is in place now. Once it is packed again, you can cover the area with gauze and papertape. Shower per regular routine with the bandage on and replace the bandage after showering versus taking a bath/ birdbath.  If you do not think you can pack the wound, please call the office and come to the office before 11:00AM tomorrow. They can teach you how to pack the area.    You can take tylenol, ibuprofen  or roxicodone for the pain medication.  Future Appointments  Date Time Provider Department Center  10/04/2024 10:15 AM Kallie Manuelita BROCKS, MD [facility] None   Manuelita Kallie, MD [facility] [address] [place], [place] [address] [phone] (office)

## 2024-10-04 ENCOUNTER — Ambulatory Visit (INDEPENDENT_AMBULATORY_CARE_PROVIDER_SITE_OTHER): Admitting: General Surgery

## 2024-10-04 VITALS — BP 99/65 | HR 82 | Temp 97.9°F | Resp 12 | Ht 63.0 in | Wt 129.0 lb

## 2024-10-04 DIAGNOSIS — L988 Other specified disorders of the skin and subcutaneous tissue: Secondary | ICD-10-CM

## 2024-10-04 DIAGNOSIS — T8131XA Disruption of external operation (surgical) wound, not elsewhere classified, initial encounter: Secondary | ICD-10-CM

## 2024-10-04 NOTE — Progress Notes (Signed)
 [facility]  Doing well. No issues. Packing going well.  BP 99/65   Pulse 82   Temp 97.9 F (36.6 C) (Oral)   Resp 12   Ht 5' 3 (1.6 m)   Wt 129 lb (58.5 kg)   LMP 09/01/2024 (Exact Date) Comment: Urine Pregnancy Test Negative as of 09/15/24.  SpO2 97%   BMI 22.85 kg/m  Breast on right healing, <1cm opening and packing removed, cavity with granulation, minor drainage, no erythema   Patient s/p excision of abscess cavity on the right and dehiscence of the wound. Skin lesion on the breast came back basal cell and will need margin.  Keeping packing for the next 5-7 days, likely after this time the area will be small and will only require some neosporin in the cavity and covered. Call with any issues. Finish your antibiotics.  We will discuss the re-excision of your breast skin lesion/ basal cell area at that appt.   Future Appointments  Date Time Provider Department Center  10/04/2024 10:15 AM Kallie Manuelita BROCKS, MD [facility] None  10/25/2024 10:00 AM Kallie Manuelita BROCKS, MD [facility] None   Manuelita Kallie, MD [facility] [address] [place], [place] [address] [phone] (office)

## 2024-10-04 NOTE — Patient Instructions (Signed)
 Keeping packing for the next 5-7 days, likely after this time the area will be small and will only require some neosporin in the cavity and covered. Call with any issues. Finish your antibiotics.  We will discuss the re-excision of your breast skin lesion/ basal cell area at that appt.

## 2024-10-25 ENCOUNTER — Encounter: Payer: Self-pay | Admitting: General Surgery

## 2024-10-25 ENCOUNTER — Ambulatory Visit: Admitting: General Surgery

## 2024-10-25 VITALS — BP 97/64 | HR 95 | Temp 97.8°F | Resp 12 | Ht 63.0 in | Wt 127.0 lb

## 2024-10-25 DIAGNOSIS — L988 Other specified disorders of the skin and subcutaneous tissue: Secondary | ICD-10-CM

## 2024-10-25 DIAGNOSIS — L309 Dermatitis, unspecified: Secondary | ICD-10-CM

## 2024-10-25 DIAGNOSIS — C44511 Basal cell carcinoma of skin of breast: Secondary | ICD-10-CM | POA: Insufficient documentation

## 2024-10-25 DIAGNOSIS — T8131XA Disruption of external operation (surgical) wound, not elsewhere classified, initial encounter: Secondary | ICD-10-CM

## 2024-10-25 MED ORDER — TRIAMCINOLONE ACETONIDE 0.5 % EX OINT: 1.0000 | TOPICAL_OINTMENT | Freq: Two times a day (BID) | CUTANEOUS | 0 refills | Status: AC

## 2024-10-25 NOTE — Progress Notes (Unsigned)
 [facility]  Future Appointments  Date Time Provider Department Center  01/04/2025  3:00 PM Kallie Manuelita BROCKS, MD [facility] None

## 2024-10-25 NOTE — Patient Instructions (Signed)
 Will send in a cream for eczema.  Will plan for excision of the skin on the 11 o'clock position of the breast where the prior basal cell cancer was removed to ensure adequate margins. Will plan to do this with local injection and plan to have sutures we leave in and remove.

## 2024-10-31 DIAGNOSIS — N951 Menopausal and female climacteric states: Secondary | ICD-10-CM | POA: Diagnosis not present

## 2024-10-31 DIAGNOSIS — R5383 Other fatigue: Secondary | ICD-10-CM | POA: Diagnosis not present

## 2024-12-06 ENCOUNTER — Telehealth: Payer: Self-pay | Admitting: *Deleted

## 2024-12-06 NOTE — Telephone Encounter (Signed)
 Surgical Date: 09/19/2024 Procedure: Excision Breast Lesion, Right  Received call from patient [phone]~ telephone.   Patient reports that incision has not healed as of yet. States that she has thick milky white drainage from incision. States that there is no color to drainage. Denies redness, warmth or hardness to area. Denies fever/ chills. Patient does state that area is achy at times.   Patient requested ABTx. Advised that no indication for ABTx noted at this time. Advised if Sx change to contact office.   Appointment scheduled.

## 2024-12-12 ENCOUNTER — Ambulatory Visit: Admitting: General Surgery

## 2024-12-12 ENCOUNTER — Encounter: Payer: Self-pay | Admitting: General Surgery

## 2024-12-12 VITALS — BP 95/68 | HR 75 | Temp 98.2°F | Resp 14 | Ht 63.0 in | Wt 131.0 lb

## 2024-12-12 DIAGNOSIS — C44511 Basal cell carcinoma of skin of breast: Secondary | ICD-10-CM

## 2024-12-12 DIAGNOSIS — T8131XA Disruption of external operation (surgical) wound, not elsewhere classified, initial encounter: Secondary | ICD-10-CM

## 2024-12-12 MED ORDER — DOXYCYCLINE HYCLATE 50 MG PO CAPS: 50.0000 mg | ORAL_CAPSULE | Freq: Two times a day (BID) | ORAL | 0 refills | Status: AC

## 2024-12-12 NOTE — Patient Instructions (Signed)
 Decide if you would rather do an office excision of the skin area where the basal cell at 11 o'clock under local or prefer in the operating room. At the same time I could excise the skin that is not closing at the 3 o'clock area and we would plan to do stitches we removed instead of the glue.  Call and let the office know. Try to doxycycline  to see if that helps clear up the drainage from the 3 o'clock area.

## 2024-12-12 NOTE — Progress Notes (Signed)
 [facility]  Patient was having some drainage from the 3 o'clock where the breast abscess was removed.  She still needs the excision of the right breast 11 o 'clock area where the 2mm basal cell was excised but needs a 4mm margin to ensure lower risk of recurrence.  BP 95/68   Pulse 75   Temp 98.2 F (36.8 C) (Oral)   Resp 14   Ht 5' 3 (1.6 m)   Wt 131 lb (59.4 kg)   SpO2 96%   BMI 23.21 kg/m  Right breast 11 o'clock healed, 3 o'clock minor area of dehiscence remains <21mm, superficial, minor yellowish drainage on bandaid.  Discussed with patient and offered local excision of the 11 o'clock skin and the 3 o'clock dehiscence versus us  doing something in the OR. Discussed her worries about just local with anxiety but also issues with OR and sedation and nausea.  Decide if you would rather do an office excision of the skin area where the basal cell at 11 o'clock under local or prefer in the operating room. At the same time I could excise the skin that is not closing at the 3 o'clock area and we would plan to do stitches we removed instead of the glue.  Call and let the office know. Try to doxycycline  to see if that helps clear up the drainage from the 3 o'clock area.   We left her on the schedule 1/15 until she decides what she wants.  Future Appointments  Date Time Provider Department Center  01/04/2025  3:00 PM Kallie Manuelita BROCKS, MD [facility] None    Manuelita Kallie, MD [facility] [address] [place], [place] [address] [phone] (office)

## 2025-01-04 ENCOUNTER — Ambulatory Visit: Admitting: General Surgery
